# Patient Record
Sex: Female | Born: 1985 | Race: Black or African American | Hispanic: No | Marital: Married | Smoking: Former smoker
Health system: Southern US, Community
[De-identification: ages and names within clinical notes are randomized; demographics above are authoritative.]

## PROBLEM LIST (undated history)

## (undated) DIAGNOSIS — I82409 Acute embolism and thrombosis of unspecified deep veins of unspecified lower extremity: Secondary | ICD-10-CM

## (undated) DIAGNOSIS — I2699 Other pulmonary embolism without acute cor pulmonale: Secondary | ICD-10-CM

## (undated) HISTORY — PX: BACK SURGERY: SHX140

---

## 2013-11-27 ENCOUNTER — Emergency Department (HOSPITAL_COMMUNITY)
Admission: EM | Admit: 2013-11-27 | Discharge: 2013-11-27 | Disposition: A | Discharge status: Home / Self Care | Attending: Emergency Medicine | Admitting: Emergency Medicine

## 2013-11-27 ENCOUNTER — Encounter (HOSPITAL_COMMUNITY): Payer: Self-pay | Admitting: Emergency Medicine

## 2013-11-27 ENCOUNTER — Emergency Department (HOSPITAL_COMMUNITY)

## 2013-11-27 DIAGNOSIS — W108XXA Fall (on) (from) other stairs and steps, initial encounter: Secondary | ICD-10-CM | POA: Insufficient documentation

## 2013-11-27 DIAGNOSIS — Y92038 Other place in apartment as the place of occurrence of the external cause: Secondary | ICD-10-CM | POA: Diagnosis not present

## 2013-11-27 DIAGNOSIS — Y9389 Activity, other specified: Secondary | ICD-10-CM | POA: Diagnosis not present

## 2013-11-27 DIAGNOSIS — S82435A Nondisplaced oblique fracture of shaft of left fibula, initial encounter for closed fracture: Secondary | ICD-10-CM | POA: Diagnosis not present

## 2013-11-27 DIAGNOSIS — S99812A Other specified injuries of left ankle, initial encounter: Secondary | ICD-10-CM | POA: Diagnosis present

## 2013-11-27 DIAGNOSIS — S82402A Unspecified fracture of shaft of left fibula, initial encounter for closed fracture: Secondary | ICD-10-CM

## 2013-11-27 MED ORDER — ONDANSETRON HCL 4 MG/2ML IJ SOLN
4.0000 mg | Freq: Once | INTRAMUSCULAR | Status: AC
  Administered 2013-11-27: 4 mg via INTRAVENOUS
  Filled 2013-11-27: qty 2

## 2013-11-27 MED ORDER — OXYCODONE-ACETAMINOPHEN 5-325 MG PO TABS
1.0000 | ORAL_TABLET | Freq: Once | ORAL | Status: AC
  Administered 2013-11-27: 1 via ORAL
  Filled 2013-11-27: qty 1

## 2013-11-27 MED ORDER — OXYCODONE-ACETAMINOPHEN 5-325 MG PO TABS: 1.0000 | ORAL_TABLET | Freq: Four times a day (QID) | ORAL | Status: DC | PRN

## 2013-11-27 MED ORDER — ONDANSETRON HCL 4 MG PO TABS: 4.0000 mg | ORAL_TABLET | Freq: Four times a day (QID) | ORAL | Status: DC

## 2013-11-27 MED ORDER — MORPHINE SULFATE 4 MG/ML IJ SOLN
4.0000 mg | Freq: Once | INTRAMUSCULAR | Status: AC
  Administered 2013-11-27: 4 mg via INTRAVENOUS
  Filled 2013-11-27: qty 1

## 2013-11-27 MED ORDER — LORAZEPAM 2 MG/ML IJ SOLN
1.0000 mg | Freq: Once | INTRAMUSCULAR | Status: AC
  Administered 2013-11-27: 1 mg via INTRAVENOUS
  Filled 2013-11-27: qty 1

## 2013-11-27 NOTE — ED Notes (Signed)
Patient medicated prior to DC home, see MAR  

## 2013-11-27 NOTE — ED Provider Notes (Signed)
CSN: [id number]     Arrival date & time 11/27/13  0351 History   First MD Initiated Contact with Patient 11/27/13 0356     Chief Complaint  Patient presents with  . Fall  . Ankle Pain     (Consider location/radiation/quality/duration/timing/severity/associated sxs/prior Treatment) HPI Comments: The patient is an otherwise healthy 28 year old female who presents the emergency department after a fall. She reports that she was trying to walk down her apartment complex stairs around 3 this morning when she fell. She did not hit her head or lose consciousness. She is complaining of left ankle pain. She describes the pain as throbbing. She has full sensation to her foot, but describes it as "wearing her foot is waking up from being really cold". She can move all of her toes. No break in the skin. No other injuries.  Patient is a 28 y.o. female presenting with fall and ankle pain. The history is provided by the patient. No language interpreter was used.  Fall Associated symptoms include arthralgias and joint swelling. Pertinent negatives include no abdominal pain, chest pain, chills, fever, nausea or vomiting.  Ankle Pain Associated symptoms: no fever     History reviewed. No pertinent past medical history. History reviewed. No pertinent past surgical history. No family history on file. History  Substance Use Topics  . Smoking status: Not on file  . Smokeless tobacco: Not on file  . Alcohol Use: Not on file   OB History   Grav Para Term Preterm Abortions TAB SAB Ect Mult Living                 Review of Systems  Constitutional: Negative for fever and chills.  Respiratory: Negative for shortness of breath.   Cardiovascular: Negative for chest pain.  Gastrointestinal: Negative for nausea, vomiting and abdominal pain.  Musculoskeletal: Positive for arthralgias, gait problem and joint swelling.  All other systems reviewed and are negative.     Allergies  Ibuprofen  Home  Medications   Prior to Admission medications   Not on File   BP 120/62  Pulse 89  Temp(Src) 97.6 F (36.4 C) (Oral)  Resp 16  SpO2 100%  LMP 11/19/2013 Physical Exam  Nursing note and vitals reviewed. Constitutional: She is oriented to person, place, and time. She appears well-developed and well-nourished. She appears distressed.  Patient is very tearful  HENT:  Head: Normocephalic and atraumatic.  Right Ear: External ear normal.  Left Ear: External ear normal.  Nose: Nose normal.  Mouth/Throat: Oropharynx is clear and moist.  Eyes: Conjunctivae are normal.  Neck: Normal range of motion.  Cardiovascular: Normal rate, regular rhythm, normal heart sounds, intact distal pulses and normal pulses.   Pulses:      Dorsalis pedis pulses are 2+ on the left side.       Posterior tibial pulses are 2+ on the left side.  Capillary refill < 3 seconds in all toes  Pulmonary/Chest: Effort normal and breath sounds normal. No stridor. No respiratory distress. She has no wheezes. She has no rales.  Abdominal: Soft. She exhibits no distension.  Musculoskeletal:       Left ankle: She exhibits decreased range of motion.  Tender to palpation diffusely over left foot and ankle. Mild swelling. Neurovascularly intact, compartments soft.  No deformity seen.  Patient with full sensation and ability to move toes. ROM decreased due to pain.   Neurological: She is alert and oriented to person, place, and time. She has normal strength.  Skin: Skin is warm and dry. She is not diaphoretic. No erythema.  Psychiatric: She has a normal mood and affect. Her behavior is normal.    ED Course  Procedures (including critical care time) Labs Review Labs Reviewed - No data to display  Imaging Review Dg Ankle Complete Left  11/27/2013   CLINICAL DATA:  Fall down steps, lateral malleolus pain.  EXAM: LEFT ANKLE COMPLETE - 3+ VIEW; LEFT FOOT - COMPLETE 3+ VIEW  COMPARISON:  None.  FINDINGS: Oblique nondisplaced  distal fibular diaphyseal fracture above the ankle mortise. The ankle mortise appears congruent and tibiofibular syndesmosis is intact. No dislocation. No destructive bony lesions.  Congenitally foreshortened fourth proximal phalanx. Mild lateral ankle soft tissue swelling without subcutaneous gas or radiopaque foreign bodies.  IMPRESSION: Nondisplaced distal fibular diaphyseal fracture without dislocation.   Electronically Signed   By: Awilda Metro   On: 11/27/2013 05:12   Dg Foot Complete Left  11/27/2013   CLINICAL DATA:  Fall down steps, lateral malleolus pain.  EXAM: LEFT ANKLE COMPLETE - 3+ VIEW; LEFT FOOT - COMPLETE 3+ VIEW  COMPARISON:  None.  FINDINGS: Oblique nondisplaced distal fibular diaphyseal fracture above the ankle mortise. The ankle mortise appears congruent and tibiofibular syndesmosis is intact. No dislocation. No destructive bony lesions.  Congenitally foreshortened fourth proximal phalanx. Mild lateral ankle soft tissue swelling without subcutaneous gas or radiopaque foreign bodies.  IMPRESSION: Nondisplaced distal fibular diaphyseal fracture without dislocation.   Electronically Signed   By: Awilda Metro   On: 11/27/2013 05:12     EKG Interpretation None      MDM   Final diagnoses:  Closed fibular fracture, left, initial encounter    Patient present to emergency department with close fibular fracture after falling down steps. No other injuries. Neurovascularly intact in compartment is soft. Patient placed in splint and given orthopedic follow up. Patient feeling significantly improved after IV narcotics. Discussed reasons to return to ED immediately. Vital signs stable for discharge. Patient / Family / Caregiver informed of clinical course, understand medical decision-making process, and agree with plan.     Mora Bellman, PA-C 11/27/13 [phone]

## 2013-11-27 NOTE — Discharge Instructions (Signed)
Fibular Fracture, Ankle, Adult, Undisplaced, Treated With Immobilization °A simple fracture of the bone below the knee on the outside of your leg (fibula) usually heals without problems. °CAUSES °Typically, a fibular fracture occurs as a result of trauma. A blow to the side of your leg or a powerful twisting movement can cause a fracture. Fibular fractures are often seen as a result of football, soccer, or skiing injuries. °SYMPTOMS °Symptoms of a fibular fracture can include: °· Pain. °· Shortening or abnormal alignment of your lower leg (angulation). °DIAGNOSIS °A health care provider will need to examine the leg. X-ray exams will be ordered for further to confirm the fracture and evaluate the extent and of the injury. °TREATMENT  °Typically, a cast or immobilizer is applied. Sometimes a splint is placed on these fractures if it is needed for comfort or if the bones are badly out of place. Crutches may be needed to help you get around.  °HOME CARE INSTRUCTIONS  °· Apply ice to the injured area: °¨ Put ice in a plastic bag. °¨ Place a towel between your skin and the bag. °¨ Leave the ice on for 20 minutes, 2-3 times a day. °· Use crutches as directed. Resume walking without crutches as directed by your health care provider or when comfortable doing so. °· Only take over-the-counter or prescription medicines for pain, discomfort, or fever as directed by your health care provider. °· Keeping your leg raised may lessen swelling. °· If you have a removable splint or boot, do not remove the boot unless directed by your health care provider. °· Do not not drive a car or operate a motor vehicle until your health care provider specifically tells you it is safe to do so. °SEEK IMMEDIATE MEDICAL CARE IF:  °· Your cast gets damaged or breaks. °· You have continued severe pain or more swelling than you did before the cast was put on, or the pain is not controlled with medications. °· Your skin or nails below the injury turn  blue or grey, or feel cold or numb. °· There is a bad smell or pus coming from under the cast. °· You develop severe pain in ankle or foot. °MAKE SURE YOU:  °· Understand these instructions. °· Will watch your condition. °· Will get help right away if you are not doing well or get worse. °Document Released: 11/05/2001 Document Revised: 12/04/2012 Document Reviewed: 09/25/2012 °[employer]® Patient Information ©2015 [employer]. This information is not intended to replace advice given to you by your health care provider. Make sure you discuss any questions you have with your health care provider. ° °

## 2013-11-27 NOTE — ED Notes (Signed)
Pt arrives by EMS with complaints of falling off the bottom stair at her apartment complex at ~0300 this AM.  Pt reports some alcohol intake tonight-complaining of pain and swelling to left ankle and per EMS, some swelling to left ankle and lateral deformity towards heel area-EMS administered 150 mcg Fentanyl IV-#20 left ACF-No LOC-No neck or back pain

## 2013-11-27 NOTE — ED Notes (Signed)
Ortho tech at bedside 

## 2013-11-27 NOTE — ED Notes (Signed)
Ortho tech paged again via Designer, television/film setoperator by Misty StanleyLisa, AA

## 2013-11-27 NOTE — ED Notes (Signed)
DC instructions reviewed with patient Patient aware of need to make and keep f/u appointment with specialist Rx x 2 reviewed with patient along with DC instructions Patient agrees to and v/u of DC instructions, f/u care and Rx x 2 at time of DC Female family member who is at the bedside will be providing transportation home Patient alert and oriented x 4 and in NAD upon time of DC from ED

## 2013-11-27 NOTE — ED Notes (Addendum)
Assumed care of patient  Patient resting with eye closed--RR WNL, even and unlabored with equal rise and fall of chest Patient in NAD Female visitor at bedside Awaiting Ortho tech--phone busy, ED Charge Nurse aware Ortho tech paged

## 2013-11-27 NOTE — ED Notes (Signed)
Bed: WA14 Expected date:  Expected time:  Means of arrival:  Comments: EMS-fall 

## 2013-11-28 NOTE — ED Provider Notes (Signed)
Medical screening examination/treatment/procedure(s) were performed by non-physician practitioner and as supervising physician I was immediately available for consultation/collaboration.   EKG Interpretation None       Kamare Caspers, MD 11/28/13 0130 

## 2013-12-04 ENCOUNTER — Encounter (HOSPITAL_COMMUNITY): Payer: Self-pay | Admitting: Pharmacy Technician

## 2013-12-08 ENCOUNTER — Inpatient Hospital Stay (HOSPITAL_COMMUNITY)
Admission: RE | Admit: 2013-12-08 | Discharge: 2013-12-08 | Disposition: A | Discharge status: Home / Self Care | Source: Ambulatory Visit

## 2013-12-08 ENCOUNTER — Encounter (HOSPITAL_COMMUNITY): Payer: Self-pay

## 2013-12-10 MED ORDER — CEFAZOLIN SODIUM-DEXTROSE 2-3 GM-% IV SOLR
2.0000 g | INTRAVENOUS | Status: AC
  Administered 2013-12-11: 2 g via INTRAVENOUS
  Filled 2013-12-10: qty 50

## 2013-12-10 MED ORDER — ACETAMINOPHEN 500 MG PO TABS
1000.0000 mg | ORAL_TABLET | Freq: Once | ORAL | Status: AC
  Administered 2013-12-11: 1000 mg via ORAL
  Filled 2013-12-10: qty 2

## 2013-12-11 ENCOUNTER — Encounter (HOSPITAL_COMMUNITY)
Admission: RE | Disposition: A | Discharge status: Home / Self Care | Payer: Self-pay | Source: Ambulatory Visit | Attending: Orthopedic Surgery

## 2013-12-11 ENCOUNTER — Ambulatory Visit (HOSPITAL_COMMUNITY)
Admission: RE | Admit: 2013-12-11 | Discharge: 2013-12-11 | Disposition: A | Discharge status: Home / Self Care | Source: Ambulatory Visit | Attending: Orthopedic Surgery | Admitting: Orthopedic Surgery

## 2013-12-11 ENCOUNTER — Ambulatory Visit (HOSPITAL_COMMUNITY)

## 2013-12-11 ENCOUNTER — Ambulatory Visit (HOSPITAL_COMMUNITY): Admitting: Certified Registered"

## 2013-12-11 ENCOUNTER — Encounter (HOSPITAL_COMMUNITY): Payer: Self-pay | Admitting: Certified Registered"

## 2013-12-11 ENCOUNTER — Encounter (HOSPITAL_COMMUNITY): Admitting: Certified Registered"

## 2013-12-11 DIAGNOSIS — Z87891 Personal history of nicotine dependence: Secondary | ICD-10-CM | POA: Diagnosis not present

## 2013-12-11 DIAGNOSIS — Z888 Allergy status to other drugs, medicaments and biological substances status: Secondary | ICD-10-CM | POA: Insufficient documentation

## 2013-12-11 DIAGNOSIS — W19XXXA Unspecified fall, initial encounter: Secondary | ICD-10-CM | POA: Diagnosis not present

## 2013-12-11 DIAGNOSIS — Z419 Encounter for procedure for purposes other than remedying health state, unspecified: Secondary | ICD-10-CM

## 2013-12-11 DIAGNOSIS — Y92009 Unspecified place in unspecified non-institutional (private) residence as the place of occurrence of the external cause: Secondary | ICD-10-CM | POA: Insufficient documentation

## 2013-12-11 DIAGNOSIS — Z79899 Other long term (current) drug therapy: Secondary | ICD-10-CM | POA: Diagnosis not present

## 2013-12-11 DIAGNOSIS — S82892A Other fracture of left lower leg, initial encounter for closed fracture: Secondary | ICD-10-CM | POA: Diagnosis present

## 2013-12-11 DIAGNOSIS — S93432A Sprain of tibiofibular ligament of left ankle, initial encounter: Secondary | ICD-10-CM | POA: Diagnosis present

## 2013-12-11 DIAGNOSIS — S82842A Displaced bimalleolar fracture of left lower leg, initial encounter for closed fracture: Secondary | ICD-10-CM | POA: Diagnosis present

## 2013-12-11 DIAGNOSIS — S82899A Other fracture of unspecified lower leg, initial encounter for closed fracture: Secondary | ICD-10-CM

## 2013-12-11 HISTORY — PX: ORIF ANKLE FRACTURE: SHX5408

## 2013-12-11 LAB — CBC WITH DIFFERENTIAL/PLATELET
BASOS PCT: 0 % (ref 0–1)
Basophils Absolute: 0 10*3/uL (ref 0.0–0.1)
Eosinophils Absolute: 0.1 10*3/uL (ref 0.0–0.7)
Eosinophils Relative: 2 % (ref 0–5)
HCT: 35.5 % — ABNORMAL LOW (ref 36.0–46.0)
Hemoglobin: 12 g/dL (ref 12.0–15.0)
Lymphocytes Relative: 41 % (ref 12–46)
Lymphs Abs: 2.2 10*3/uL (ref 0.7–4.0)
MCH: 28.2 pg (ref 26.0–34.0)
MCHC: 33.8 g/dL (ref 30.0–36.0)
MCV: 83.5 fL (ref 78.0–100.0)
MONO ABS: 0.4 10*3/uL (ref 0.1–1.0)
Monocytes Relative: 8 % (ref 3–12)
Neutro Abs: 2.7 10*3/uL (ref 1.7–7.7)
Neutrophils Relative %: 49 % (ref 43–77)
Platelets: 399 10*3/uL (ref 150–400)
RBC: 4.25 MIL/uL (ref 3.87–5.11)
RDW: 13.6 % (ref 11.5–15.5)
WBC: 5.5 10*3/uL (ref 4.0–10.5)

## 2013-12-11 LAB — COMPREHENSIVE METABOLIC PANEL
ALT: 115 U/L — ABNORMAL HIGH (ref 0–35)
AST: 61 U/L — AB (ref 0–37)
Albumin: 3.1 g/dL — ABNORMAL LOW (ref 3.5–5.2)
Alkaline Phosphatase: 54 U/L (ref 39–117)
Anion gap: 11 (ref 5–15)
BUN: 14 mg/dL (ref 6–23)
CO2: 25 mEq/L (ref 19–32)
Calcium: 9.4 mg/dL (ref 8.4–10.5)
Chloride: 102 mEq/L (ref 96–112)
Creatinine, Ser: 0.9 mg/dL (ref 0.50–1.10)
GFR calc Af Amer: >90 mL/min (ref >90)
GFR, EST NON AFRICAN AMERICAN: 88 mL/min — AB (ref >90)
Glucose, Bld: 97 mg/dL (ref 70–99)
Potassium: 4.7 mEq/L (ref 3.7–5.3)
Sodium: 138 mEq/L (ref 137–147)
Total Bilirubin: <0.2 mg/dL — ABNORMAL LOW (ref 0.3–1.2)
Total Protein: 7.3 g/dL (ref 6.0–8.3)

## 2013-12-11 LAB — PROTIME-INR
INR: 1.12 (ref 0.00–1.49)
Prothrombin Time: 14.5 seconds (ref 11.6–15.2)

## 2013-12-11 LAB — HCG, SERUM, QUALITATIVE: Preg, Serum: NEGATIVE

## 2013-12-11 SURGERY — OPEN REDUCTION INTERNAL FIXATION (ORIF) ANKLE FRACTURE
Anesthesia: General | Laterality: Left

## 2013-12-11 MED ORDER — METHOCARBAMOL 500 MG PO TABS
ORAL_TABLET | ORAL | Status: AC
  Administered 2013-12-11: 500 mg via ORAL
  Filled 2013-12-11: qty 1

## 2013-12-11 MED ORDER — MIDAZOLAM HCL 5 MG/5ML IJ SOLN
INTRAMUSCULAR | Status: DC | PRN
  Administered 2013-12-11: 2 mg via INTRAVENOUS

## 2013-12-11 MED ORDER — PROPOFOL 10 MG/ML IV BOLUS
INTRAVENOUS | Status: AC
  Filled 2013-12-11: qty 20

## 2013-12-11 MED ORDER — FENTANYL CITRATE 0.05 MG/ML IJ SOLN
INTRAMUSCULAR | Status: AC
  Filled 2013-12-11: qty 5

## 2013-12-11 MED ORDER — KETOROLAC TROMETHAMINE 30 MG/ML IJ SOLN
30.0000 mg | Freq: Once | INTRAMUSCULAR | Status: AC
  Administered 2013-12-11: 30 mg via INTRAVENOUS

## 2013-12-11 MED ORDER — METHOCARBAMOL 500 MG PO TABS
500.0000 mg | ORAL_TABLET | Freq: Once | ORAL | Status: AC
  Administered 2013-12-11: 500 mg via ORAL

## 2013-12-11 MED ORDER — OXYCODONE HCL 5 MG PO TABS: 5.0000 mg | ORAL_TABLET | Freq: Four times a day (QID) | ORAL | Status: DC | PRN

## 2013-12-11 MED ORDER — ONDANSETRON HCL 4 MG/2ML IJ SOLN
4.0000 mg | Freq: Once | INTRAMUSCULAR | Status: AC | PRN
Start: 2013-12-11 — End: 2013-12-11
  Administered 2013-12-11: 4 mg via INTRAVENOUS

## 2013-12-11 MED ORDER — KETOROLAC TROMETHAMINE 30 MG/ML IJ SOLN
INTRAMUSCULAR | Status: AC
  Administered 2013-12-11: 30 mg via INTRAVENOUS
  Filled 2013-12-11: qty 1

## 2013-12-11 MED ORDER — HYDROMORPHONE HCL 1 MG/ML IJ SOLN
0.2500 mg | INTRAMUSCULAR | Status: DC | PRN
  Administered 2013-12-11 (×4): 0.5 mg via INTRAVENOUS

## 2013-12-11 MED ORDER — HYDROMORPHONE HCL 1 MG/ML IJ SOLN
INTRAMUSCULAR | Status: AC
  Administered 2013-12-11: 0.5 mg via INTRAVENOUS
  Filled 2013-12-11: qty 1

## 2013-12-11 MED ORDER — ONDANSETRON HCL 4 MG/2ML IJ SOLN
INTRAMUSCULAR | Status: DC | PRN
  Administered 2013-12-11: 4 mg via INTRAVENOUS

## 2013-12-11 MED ORDER — BUPIVACAINE HCL (PF) 0.25 % IJ SOLN
INTRAMUSCULAR | Status: AC
  Filled 2013-12-11: qty 30

## 2013-12-11 MED ORDER — FENTANYL CITRATE 0.05 MG/ML IJ SOLN
INTRAMUSCULAR | Status: DC | PRN
  Administered 2013-12-11: 50 ug via INTRAVENOUS
  Administered 2013-12-11: 100 ug via INTRAVENOUS
  Administered 2013-12-11: 50 ug via INTRAVENOUS
  Administered 2013-12-11: 100 ug via INTRAVENOUS
  Administered 2013-12-11: 50 ug via INTRAVENOUS

## 2013-12-11 MED ORDER — DEXAMETHASONE SODIUM PHOSPHATE 4 MG/ML IJ SOLN
INTRAMUSCULAR | Status: AC
  Filled 2013-12-11: qty 2

## 2013-12-11 MED ORDER — LACTATED RINGERS IV SOLN
INTRAVENOUS | Status: DC
  Administered 2013-12-11: 08:00:00 via INTRAVENOUS

## 2013-12-11 MED ORDER — MIDAZOLAM HCL 2 MG/2ML IJ SOLN
INTRAMUSCULAR | Status: AC
  Filled 2013-12-11: qty 2

## 2013-12-11 MED ORDER — OXYCODONE-ACETAMINOPHEN 5-325 MG PO TABS
1.0000 | ORAL_TABLET | Freq: Once | ORAL | Status: AC
  Administered 2013-12-11: 1 via ORAL

## 2013-12-11 MED ORDER — OXYCODONE-ACETAMINOPHEN 5-325 MG PO TABS
ORAL_TABLET | ORAL | Status: AC
  Administered 2013-12-11: 1 via ORAL
  Filled 2013-12-11: qty 1

## 2013-12-11 MED ORDER — OXYCODONE-ACETAMINOPHEN 5-325 MG PO TABS: 1.0000 | ORAL_TABLET | Freq: Four times a day (QID) | ORAL | Status: DC | PRN

## 2013-12-11 MED ORDER — METHOCARBAMOL 500 MG PO TABS: 500.0000 mg | ORAL_TABLET | Freq: Four times a day (QID) | ORAL | Status: DC | PRN

## 2013-12-11 MED ORDER — LIDOCAINE HCL 2 % EX GEL
CUTANEOUS | Status: AC
  Filled 2013-12-11: qty 20

## 2013-12-11 MED ORDER — ONDANSETRON HCL 4 MG/2ML IJ SOLN
INTRAMUSCULAR | Status: AC
  Filled 2013-12-11: qty 2

## 2013-12-11 MED ORDER — LIDOCAINE HCL (CARDIAC) 20 MG/ML IV SOLN
INTRAVENOUS | Status: AC
  Filled 2013-12-11: qty 5

## 2013-12-11 MED ORDER — CHLORHEXIDINE GLUCONATE 4 % EX LIQD
60.0000 mL | Freq: Once | CUTANEOUS | Status: DC
  Filled 2013-12-11: qty 60

## 2013-12-11 MED ORDER — SUCCINYLCHOLINE CHLORIDE 20 MG/ML IJ SOLN
INTRAMUSCULAR | Status: AC
  Filled 2013-12-11: qty 1

## 2013-12-11 MED ORDER — ONDANSETRON HCL 4 MG/2ML IJ SOLN
INTRAMUSCULAR | Status: AC
  Administered 2013-12-11: 4 mg via INTRAVENOUS
  Filled 2013-12-11: qty 2

## 2013-12-11 MED ORDER — PROPOFOL 10 MG/ML IV BOLUS
INTRAVENOUS | Status: DC | PRN
  Administered 2013-12-11: 200 mg via INTRAVENOUS

## 2013-12-11 MED ORDER — PROMETHAZINE HCL 12.5 MG PO TABS: 12.5000 mg | ORAL_TABLET | Freq: Four times a day (QID) | ORAL | Status: DC | PRN

## 2013-12-11 MED ORDER — LIDOCAINE HCL (CARDIAC) 20 MG/ML IV SOLN
INTRAVENOUS | Status: DC | PRN
  Administered 2013-12-11: 40 mg via INTRAVENOUS

## 2013-12-11 SURGICAL SUPPLY — 65 items
BANDAGE ELASTIC 4 VELCRO ST LF (GAUZE/BANDAGES/DRESSINGS) IMPLANT
BANDAGE ELASTIC 6 VELCRO ST LF (GAUZE/BANDAGES/DRESSINGS) ×3 IMPLANT
BANDAGE ESMARK 6X9 LF (GAUZE/BANDAGES/DRESSINGS) ×1 IMPLANT
BLADE SURG 10 STRL SS (BLADE) ×3 IMPLANT
BNDG COHESIVE 4X5 TAN STRL (GAUZE/BANDAGES/DRESSINGS) ×3 IMPLANT
BNDG ESMARK 6X9 LF (GAUZE/BANDAGES/DRESSINGS) ×3
BNDG GAUZE ELAST 4 BULKY (GAUZE/BANDAGES/DRESSINGS) ×6 IMPLANT
BRUSH SCRUB DISP (MISCELLANEOUS) ×6 IMPLANT
COVER SURGICAL LIGHT HANDLE (MISCELLANEOUS) ×6 IMPLANT
DRAPE C-ARM 42X72 X-RAY (DRAPES) ×3 IMPLANT
DRAPE C-ARMOR (DRAPES) ×6 IMPLANT
DRAPE ORTHO SPLIT 77X108 STRL (DRAPES) ×6
DRAPE PROXIMA HALF (DRAPES) ×3 IMPLANT
DRAPE SURG ORHT 6 SPLT 77X108 (DRAPES) ×3 IMPLANT
DRAPE U-SHAPE 47X51 STRL (DRAPES) ×3 IMPLANT
DRSG ADAPTIC 3X8 NADH LF (GAUZE/BANDAGES/DRESSINGS) ×3 IMPLANT
DRSG EMULSION OIL 3X3 NADH (GAUZE/BANDAGES/DRESSINGS) ×3 IMPLANT
DRSG PAD ABDOMINAL 8X10 ST (GAUZE/BANDAGES/DRESSINGS) ×3 IMPLANT
ELECT REM PT RETURN 9FT ADLT (ELECTROSURGICAL) ×3
ELECTRODE REM PT RTRN 9FT ADLT (ELECTROSURGICAL) ×1 IMPLANT
GAUZE SPONGE 4X4 12PLY STRL (GAUZE/BANDAGES/DRESSINGS) IMPLANT
GLOVE BIO SURGEON STRL SZ7.5 (GLOVE) ×3 IMPLANT
GLOVE BIO SURGEON STRL SZ8 (GLOVE) ×3 IMPLANT
GLOVE BIOGEL PI IND STRL 7.5 (GLOVE) ×1 IMPLANT
GLOVE BIOGEL PI IND STRL 8 (GLOVE) ×1 IMPLANT
GLOVE BIOGEL PI INDICATOR 7.5 (GLOVE) ×2
GLOVE BIOGEL PI INDICATOR 8 (GLOVE) ×2
GOWN STRL REUS W/ TWL LRG LVL3 (GOWN DISPOSABLE) ×2 IMPLANT
GOWN STRL REUS W/ TWL XL LVL3 (GOWN DISPOSABLE) ×1 IMPLANT
GOWN STRL REUS W/TWL LRG LVL3 (GOWN DISPOSABLE) ×4
GOWN STRL REUS W/TWL XL LVL3 (GOWN DISPOSABLE) ×2
KIT BASIN OR (CUSTOM PROCEDURE TRAY) ×3 IMPLANT
KIT ROOM TURNOVER OR (KITS) ×3 IMPLANT
MANIFOLD NEPTUNE II (INSTRUMENTS) ×3 IMPLANT
NEEDLE HYPO 21X1.5 SAFETY (NEEDLE) IMPLANT
NS IRRIG 1000ML POUR BTL (IV SOLUTION) ×3 IMPLANT
PACK GENERAL/GYN (CUSTOM PROCEDURE TRAY) ×3 IMPLANT
PAD ARMBOARD 7.5X6 YLW CONV (MISCELLANEOUS) ×6 IMPLANT
PAD CAST 4YDX4 CTTN HI CHSV (CAST SUPPLIES) ×1 IMPLANT
PADDING CAST COTTON 4X4 STRL (CAST SUPPLIES) ×2
PADDING CAST COTTON 6X4 STRL (CAST SUPPLIES) IMPLANT
PENCIL BUTTON HOLSTER BLD 10FT (ELECTRODE) ×3 IMPLANT
SCREW CANC FT ST SFS 4X16 (Screw) ×3 IMPLANT
SCREW CORT 3.5X44M SELF TAP (Screw) ×3 IMPLANT
SCREW CORT 3.5X46M SELF TAP (Screw) ×3 IMPLANT
SCREW CORTEX 3.5 12MM (Screw) ×6 IMPLANT
SCREW LOCK CORT ST 3.5X12 (Screw) ×3 IMPLANT
SPONGE GAUZE 4X4 12PLY STER LF (GAUZE/BANDAGES/DRESSINGS) ×3 IMPLANT
SPONGE LAP 18X18 X RAY DECT (DISPOSABLE) ×9 IMPLANT
SPONGE SCRUB IODOPHOR (GAUZE/BANDAGES/DRESSINGS) ×3 IMPLANT
STAPLER VISISTAT 35W (STAPLE) IMPLANT
SUCTION FRAZIER TIP 10 FR DISP (SUCTIONS) ×3 IMPLANT
SUT ETHILON 2 0 FS 18 (SUTURE) ×9 IMPLANT
SUT ETHILON 3 0 PS 1 (SUTURE) ×9 IMPLANT
SUT PDS AB 2-0 CT1 27 (SUTURE) IMPLANT
SUT VIC AB 2-0 CT1 27 (SUTURE) ×6
SUT VIC AB 2-0 CT1 TAPERPNT 27 (SUTURE) ×3 IMPLANT
SUT VIC AB 2-0 CT3 27 (SUTURE) IMPLANT
SYR CONTROL 10ML LL (SYRINGE) IMPLANT
TOWEL OR 17X24 6PK STRL BLUE (TOWEL DISPOSABLE) ×3 IMPLANT
TOWEL OR 17X26 10 PK STRL BLUE (TOWEL DISPOSABLE) ×6 IMPLANT
TUBE CONNECTING 12'X1/4 (SUCTIONS) ×1
TUBE CONNECTING 12X1/4 (SUCTIONS) ×2 IMPLANT
UNDERPAD 30X30 INCONTINENT (UNDERPADS AND DIAPERS) ×3 IMPLANT
WATER STERILE IRR 1000ML POUR (IV SOLUTION) ×3 IMPLANT

## 2013-12-11 NOTE — Anesthesia Preprocedure Evaluation (Addendum)
Anesthesia Evaluation  Patient identified by MRN, date of birth, ID band Patient awake    Reviewed: Allergy & Precautions, H&P , NPO status , Patient's Chart, lab work & pertinent test results  Airway Mallampati: II TM Distance: >3 FB     Dental  (+) Teeth Intact, Dental Advisory Given   Pulmonary former smoker,  breath sounds clear to auscultation        Cardiovascular Rhythm:Regular Rate:Normal     Neuro/Psych    GI/Hepatic   Endo/Other    Renal/GU      Musculoskeletal   Abdominal   Peds  Hematology   Anesthesia Other Findings   Reproductive/Obstetrics                          Anesthesia Physical Anesthesia Plan  ASA: II  Anesthesia Plan: General   Post-op Pain Management:    Induction: Intravenous  Airway Management Planned: Oral ETT  Additional Equipment:   Intra-op Plan:   Post-operative Plan:   Informed Consent: I have reviewed the patients History and Physical, chart, labs and discussed the procedure including the risks, benefits and alternatives for the proposed anesthesia with the patient or authorized representative who has indicated his/her understanding and acceptance.   Dental advisory given  Plan Discussed with: CRNA and Anesthesiologist  Anesthesia Plan Comments: (L. Ankle fracture  Plan GA with LMA and popliteal block)        Anesthesia Quick Evaluation

## 2013-12-11 NOTE — Brief Op Note (Signed)
12/11/2013  9:59 AM  PATIENT:  Marie Cruz  28 y.o. female  PRE-OPERATIVE DIAGNOSIS:   1. LEFT BIMALLEOLAR ANKLE FRACTURE EQUIVALENT 2. SYNDESMOSIS DISRUPTION  POST-OPERATIVE DIAGNOSIS:   1. LEFT BIMALLEOLAR ANKLE FRACTURE EQUIVALENT 2. SYNDESMOSIS DISRUPTION  PROCEDURE:  Procedure(s): 1. OPEN REDUCTION INTERNAL FIXATION (ORIF) LEFT  ANKLE LATERAL MALLEOLUS 2. OPEN REDUCTION INTERNAL FIXATION (ORIF) SYNDESMOSIS (Left)  SURGEON:  Surgeon(s) and Role:    * Budd PalmerMichael H Carlethia Mesquita, MD - Primary  PHYSICIAN ASSISTANT: Montez MoritaKeith Paul, PA-C  ANESTHESIA:   general  TOURNIQUET:  * Missing tourniquet times found for documented tourniquets in log:  [id number] *  DICTATION: .Other Dictation: Dictation Number [id number]

## 2013-12-11 NOTE — H&P (Signed)
Please see my adjacent note from earlier this am.  Myrene GalasMichael Stormie Ventola, MD [facility] [phone] (p)

## 2013-12-11 NOTE — H&P (Signed)
[facility] H[facility]   Chief Complaint:  L ankle fracture HPI:    28 y/o black female sustained a fall about 2 weeks ago while at home.  Pt presented to ED for evaluation and was found have a L lateral malleolus fracture. She was seen at our office for follow up.  After additional xrays it was determined that her syndesmosis was disrupted as well.  As such she presents today for fixation of her fibula and syndesmosis   History reviewed. No pertinent past medical history.  Past Surgical History  Procedure Laterality Date  . Back surgery      History reviewed. No pertinent family history. Social History:  reports that she has quit smoking. She has never used smokeless tobacco. She reports that she drinks alcohol. She reports that she does not use illicit drugs.  Allergies:  Allergies  Allergen Reactions  . Hydrocodone-Acetaminophen Nausea And Vomiting  . Ibuprofen Nausea Only    Medications Prior to Admission  Medication Sig Dispense Refill  . acetaminophen (TYLENOL) 500 MG tablet Take 250 mg by mouth every 6 (six) hours as needed for moderate pain.      . methocarbamol (ROBAXIN) 500 MG tablet Take 500 mg by mouth daily as needed for muscle spasms.      . Multiple Vitamin (MULTIVITAMIN WITH MINERALS) TABS tablet Take 1 tablet by mouth daily.      Lorita Officer. Norgestim-Eth Estrad Triphasic (ORTHO TRI-CYCLEN LO PO) Take 1 tablet by mouth daily.      . ondansetron (ZOFRAN) 4 MG tablet Take 4 mg by mouth every 6 (six) hours as needed for nausea or vomiting.      Marland Kitchen. OVER THE COUNTER MEDICATION Take 1 capsule by mouth daily. Green Tea Complex 500 mg      . oxyCODONE (OXY IR/ROXICODONE) 5 MG immediate release tablet Take 5 mg by mouth every 6 (six) hours as needed for severe pain.      Marland Kitchen. oxyCODONE-acetaminophen (PERCOCET/ROXICET) 5-325 MG per tablet Take by mouth every 6 (six) hours as needed for severe pain.        Results for orders placed during the hospital encounter of 12/11/13 (from  the past 48 hour(s))  CBC WITH DIFFERENTIAL     Status: Abnormal   Collection Time    12/11/13  7:21 AM      Result Value Ref Range   WBC 5.5  4.0 - 10.5 K/uL   RBC 4.25  3.87 - 5.11 MIL/uL   Hemoglobin 12.0  12.0 - 15.0 g/dL   HCT 16.135.5 (*) 09.636.0 - 04.546.0 %   MCV 83.5  78.0 - 100.0 fL   MCH 28.2  26.0 - 34.0 pg   MCHC 33.8  30.0 - 36.0 g/dL   RDW 40.913.6  81.111.5 - 91.415.5 %   Platelets 399  150 - 400 K/uL   Neutrophils Relative % 49  43 - 77 %   Neutro Abs 2.7  1.7 - 7.7 K/uL   Lymphocytes Relative 41  12 - 46 %   Lymphs Abs 2.2  0.7 - 4.0 K/uL   Monocytes Relative 8  3 - 12 %   Monocytes Absolute 0.4  0.1 - 1.0 K/uL   Eosinophils Relative 2  0 - 5 %   Eosinophils Absolute 0.1  0.0 - 0.7 K/uL   Basophils Relative 0  0 - 1 %   Basophils Absolute 0.0  0.0 - 0.1 K/uL   No results found.  Review of Systems  Constitutional:  Negative for fever and chills.  Respiratory: Negative for shortness of breath and wheezing.   Cardiovascular: Negative for chest pain and palpitations.  Gastrointestinal: Negative for nausea, vomiting and abdominal pain.  Musculoskeletal:       L ankle pain  No L knee pain   Neurological: Negative for tingling, sensory change and headaches.    Blood pressure 121/81, pulse 78, temperature 97.9 F (36.6 C), temperature source Oral, resp. rate 16, height 5\' 5"  (1.651 m), weight 74.39 kg (164 lb), last menstrual period 11/23/2013, SpO2 100.00%. Physical Exam  Constitutional: She appears well-developed and well-nourished.  HENT:  Head: Normocephalic and atraumatic.  Eyes: EOM are normal.  Neck: Normal range of motion. Neck supple.  Cardiovascular: Normal rate and regular rhythm.   Respiratory: Effort normal and breath sounds normal.  GI: Soft. Bowel sounds are normal.  Musculoskeletal:  Left Lower Extremity    No gross deformity    + swelling L ankle   TTP L lateral mall and medially    No tenderness with palpation of knee, proximal fibula   Soft tissue looks  good, wrinkles with gentle compression    DPN, SPN, TN sensation intact    EHL, FHL, AT, PT, peroneals, gastroc motor intact        Assessment/Plan  28 y/o female with bimall equivalent L ankle fracture and syndesmotic disruption   OR for ORIF L fibula and syndesmosis NWB x 8 weeks outpt procedure Risks and benefits reviewed with pt, she wishes to proceed   Mearl LatinKeith W. Dreyton Roessner, PA-C [facility] [phone] (P)  12/11/2013, 7:50 AM

## 2013-12-11 NOTE — Transfer of Care (Signed)
Immediate Anesthesia Transfer of Care Note  Patient: Marie Cruz  Procedure(s) Performed: Procedure(s): OPEN REDUCTION INTERNAL FIXATION (ORIF) LEFT  ANKLE AND SYNDESMOSIS (Left)  Patient Location: PACU  Anesthesia Type:GA combined with regional for post-op pain  Level of Consciousness: awake and alert   Airway & Oxygen Therapy: Patient Spontanous Breathing and Patient connected to nasal cannula oxygen  Post-op Assessment: Report given to PACU RN, Post -op Vital signs reviewed and stable and Patient moving all extremities  Post vital signs: Reviewed and stable  Complications: No apparent anesthesia complications

## 2013-12-11 NOTE — Anesthesia Procedure Notes (Addendum)
Anesthesia Regional Block:  Popliteal block  Pre-Anesthetic Checklist: ,, timeout performed, Correct Patient, Correct Site, Correct Laterality, Correct Procedure, Correct Position, site marked, Risks and benefits discussed,  Surgical consent,  Pre-op evaluation,  At surgeon's request and post-op pain management  Laterality: Left  Prep: chloraprep       Needles:   Needle Type: Echogenic Stimulator Needle     Needle Length: 9cm 9 cm Needle Gauge: 22 and 22 G    Additional Needles: Popliteal block Narrative:  Start time: 12/11/2013 7:55 AM End time: 12/11/2013 8:00 AM Injection made incrementally with aspirations every 5 mL.  Performed by: Personally   Additional Notes: 25 cc 0.5% marcaine with 1:200 Epi injected easily   Procedure Name: LMA Insertion Date/Time: 12/11/2013 8:19 AM Performed by: Charm BargesBUTLER, Luara Faye R Pre-anesthesia Checklist: Patient identified, Emergency Drugs available, Suction available, Patient being monitored and Timeout performed Patient Re-evaluated:Patient Re-evaluated prior to inductionOxygen Delivery Method: Circle system utilized Preoxygenation: Pre-oxygenation with 100% oxygen Intubation Type: IV induction Ventilation: Mask ventilation without difficulty LMA: LMA inserted LMA Size: 4.0 Number of attempts: 1 Placement Confirmation: positive ETCO2 and breath sounds checked- equal and bilateral Tube secured with: Tape Dental Injury: Teeth and Oropharynx as per pre-operative assessment

## 2013-12-11 NOTE — Progress Notes (Signed)
I have seen and examined the patient. I agree with the findings by Mr. Renae Fickleaul.  I discussed with the patient the risks and benefits of surgery for left ankle fracture, including the possibility of infection, nerve injury, vessel injury, wound breakdown, arthritis, symptomatic hardware, DVT/ PE, loss of motion, and need for further surgery among others.  She understood these risks and wished to proceed.   Budd PalmerHANDY,Anay Rathe H, MD 12/11/2013 7:43 AM

## 2013-12-11 NOTE — Op Note (Signed)
NAME:  Marie Cruz, Marie Cruz         ACCOUNT NO.:  [id number]  MEDICAL RECORD NO.:  [id number]  LOCATION:  [facility]                         FACILITY:  [facility]  PHYSICIAN:  Doralee AlbinoMichael H. Carola FrostHandy, M.D. DATE OF BIRTH:  1985/07/30  DATE OF PROCEDURE:  12/11/2013 DATE OF DISCHARGE:                              OPERATIVE REPORT   PREOPERATIVE DIAGNOSES: 1. Left bimalleolar fracture equivalent. 2. Left ankle syndesmosis disruption.  POSTOPERATIVE DIAGNOSES: 1. Left bimalleolar fracture equivalent. 2. Left ankle syndesmosis disruption.  PROCEDURES: 1. Open reduction and internal fixation of the left lateral malleolus. 2. Open reduction and internal fixation of left ankle syndesmosis.  SURGEON:  Doralee AlbinoMichael H. Carola FrostHandy, M.D.  ASSISTANT:  Mearl LatinKeith W Paul, PA-C.  ANESTHESIA:  General.  COMPLICATIONS:  None.  DISPOSITION:  PACU.  CONDITION:  Stable.  BRIEF SUMMARY AND INDICATION OF PROCEDURE:  Marie Cruz is a very pleasant 28- year-old female, who sustained a left lateral malleolus fracture with widening of the syndesmosis and lateral talar subluxation.  She has been followed for a resolution of soft tissue swelling and now presents for definitive repair.  I discussed with her preoperative risks and benefits of surgery, including the possibility of infection, nerve injury, vessel injury, DVT, PE, heart attack, stroke, arthritis, need for hardware removal including the syndesmotic screws and the possibility of syndesmotic screw breakage.  She understood these risks and did wish to proceed.  BRIEF SUMMARY OF PROCEDURE:  Marie Cruz received a supplemental popliteal block preoperatively.  She was then taken to the operating room where general anesthesia was induced.  The left lower extremity was prepped and draped in usual sterile fashion.  Tourniquet was placed about the thigh but never inflated during the procedure.  A standard lateral approach was made to the distal fibula, carefully looking for the superficial  peroneal nerve.  The soft tissues were retracted anteriorly and the fracture site was identified.  I then took a 7-hole plate and applied this along the posterolateral aspect of her fibula and used a lobster claw to interdigitate the fracture site and to achieve an anatomic reduction.  This was secured with a 3.5 mm cortical screw proximally and distally a cancellous screw.  I then used the [employer] clamp with a small incision off the medial metaphyseal flare, reduced the syndesmosis which eliminated the lateral subluxation of the talus. This was secured with two 3.5 cortical screws achieving 3 cortices of purchase with each.  The ankle had outstanding range of motion with no obstruction.  It was dorsiflexed during placement of the screws and centered.  The lateral flouro view showed appropriate position of the fibula with regard to the incinsura with a slight anterior trajectory of the screws.  There was very slight valgus created at the lateral malleolus site with tightening of the syndesmotic screws but this was minimal and the reduction remained satisfactory.  Wound was irrigated thoroughly and then closed in standard layered fashion with 2-0 Vicryl, 3-0 nylon.  Sterile, gently compressive dressing was applied and a posterior stirrup splint.  The patient was awakened from anesthesia and transferred to PACU in stable condition.  Marie MoritaKeith Paul, PA-C assisted me throughout.  PROGNOSIS:  Marie Cruz will be nonweightbearing on the left lower extremity for  the next 8 weeks with graduated weightbearing up to tolerance, to resume at that time.  We will plan to see her back in the office in 10-14 days for removal of her sutures.  At that time, we may consider transition into a cam boot for early motion versus another 2 weeks in a cast.     Doralee AlbinoMichael H. Carola FrostHandy, M.D.     MHH/MEDQ  D:  12/11/2013  T:  12/11/2013  Job:  [id number]

## 2013-12-11 NOTE — Discharge Instructions (Addendum)
[facility] Discharge Instructions   General Discharge Instructions  WEIGHT BEARING STATUS: Nonweightbearing Left leg  RANGE OF MOTION/ACTIVITY: no ankle range of motion at this time. Do not remove splint   Diet: as you were eating previously.  Can use over the counter stool softeners and bowel preparations, such as Miralax, to help with bowel movements.  Narcotics can be constipating.  Be sure to drink plenty of fluids  STOP SMOKING OR USING NICOTINE PRODUCTS!!!!  As discussed nicotine severely impairs your body's ability to heal surgical and traumatic wounds but also impairs bone healing.  Wounds and bone heal by forming microscopic blood vessels (angiogenesis) and nicotine is a vasoconstrictor (essentially, shrinks blood vessels).  Therefore, if vasoconstriction occurs to these microscopic blood vessels they essentially disappear and are unable to deliver necessary nutrients to the healing tissue.  This is one modifiable factor that you can do to dramatically increase your chances of healing your injury.    (This means no smoking, no nicotine gum, patches, etc)  DO NOT USE NONSTEROIDAL ANTI-INFLAMMATORY DRUGS (NSAID'S)  Using products such as Advil (ibuprofen), Aleve (naproxen), Motrin (ibuprofen) for additional pain control during fracture healing can delay and/or prevent the healing response.  If you would like to take over the counter (OTC) medication, Tylenol (acetaminophen) is ok.  However, some narcotic medications that are given for pain control contain acetaminophen as well. Therefore, you should not exceed more than 4000 mg of tylenol in a day if you do not have liver disease.  Also note that there are may OTC medicines, such as cold medicines and allergy medicines that my contain tylenol as well.  If you have any questions about medications and/or interactions please ask your doctor/PA or your pharmacist.   PAIN MEDICATION USE AND EXPECTATIONS  You have likely been  given narcotic medications to help control your pain.  After a traumatic event that results in an fracture (broken bone) with or without surgery, it is ok to use narcotic pain medications to help control one's pain.  We understand that everyone responds to pain differently and each individual patient will be evaluated on a regular basis for the continued need for narcotic medications. Ideally, narcotic medication use should last no more than 6-8 weeks (coinciding with fracture healing).   As a patient it is your responsibility as well to monitor narcotic medication use and report the amount and frequency you use these medications when you come to your office visit.   We would also advise that if you are using narcotic medications, you should take a dose prior to therapy to maximize you participation.  IF YOU ARE ON NARCOTIC MEDICATIONS IT IS NOT PERMISSIBLE TO OPERATE A MOTOR VEHICLE (MOTORCYCLE/CAR/TRUCK/MOPED) OR HEAVY MACHINERY DO NOT MIX NARCOTICS WITH OTHER CNS (CENTRAL NERVOUS SYSTEM) DEPRESSANTS SUCH AS ALCOHOL       ICE AND ELEVATE INJURED/OPERATIVE EXTREMITY  Using ice and elevating the injured extremity above your heart can help with swelling and pain control.  Icing in a pulsatile fashion, such as 20 minutes on and 20 minutes off, can be followed.    Do not place ice directly on skin. Make sure there is a barrier between to skin and the ice pack.    Using frozen items such as frozen peas works well as the conform nicely to the are that needs to be iced.  USE AN ACE WRAP OR TED HOSE FOR SWELLING CONTROL  In addition to icing and elevation, Ace wraps or TED hose  are used to help limit and resolve swelling.  It is recommended to use Ace wraps or TED hose until you are informed to stop.    When using Ace Wraps start the wrapping distally (farthest away from the body) and wrap proximally (closer to the body)   Example: If you had surgery on your leg or thing and you do not have a splint on,  start the ace wrap at the toes and work your way up to the thigh        If you had surgery on your upper extremity and do not have a splint on, start the ace wrap at your fingers and work your way up to the upper arm  IF YOU ARE IN A SPLINT OR CAST DO NOT REMOVE IT FOR ANY REASON   If your splint gets wet for any reason please contact the office immediately. You may shower in your splint or cast as long as you keep it dry.  This can be done by wrapping in a cast cover or garbage back (or similar)  Do Not stick any thing down your splint or cast such as pencils, money, or hangers to try and scratch yourself with.  If you feel itchy take benadryl as prescribed on the bottle for itching  IF YOU ARE IN A CAM BOOT (BLACK BOOT)  You may remove boot periodically. Perform daily dressing changes as noted below.  Wash the liner of the boot regularly and wear a sock when wearing the boot. It is recommended that you sleep in the boot until told otherwise  CALL THE OFFICE WITH ANY QUESTIONS OR CONCERTS: [phone]   What to eat:  For your first meals, you should eat lightly; only small meals initially.  If you do not have nausea, you may eat larger meals.  Avoid spicy, greasy and heavy food.    General Anesthesia, Adult, Care After  Refer to this sheet in the next few weeks. These instructions provide you with information on caring for yourself after your procedure. Your health care provider may also give you more specific instructions. Your treatment has been planned according to current medical practices, but problems sometimes occur. Call your health care provider if you have any problems or questions after your procedure.  WHAT TO EXPECT AFTER THE PROCEDURE  After the procedure, it is typical to experience:  Sleepiness.  Nausea and vomiting. HOME CARE INSTRUCTIONS  For the first 24 hours after general anesthesia:  Have a responsible person with you.  Do not drive a car. If you are alone, do not  take public transportation.  Do not drink alcohol.  Do not take medicine that has not been prescribed by your health care provider.  Do not sign important papers or make important decisions.  You may resume a normal diet and activities as directed by your health care provider.  Change bandages (dressings) as directed.  If you have questions or problems that seem related to general anesthesia, call the hospital and ask for the anesthetist or anesthesiologist on call. SEEK MEDICAL CARE IF:  You have nausea and vomiting that continue the day after anesthesia.  You develop a rash. SEEK IMMEDIATE MEDICAL CARE IF:  You have difficulty breathing.  You have chest pain.  You have any allergic problems. Document Released: 05/22/2000 Document Revised: 10/16/2012 Document Reviewed: 08/29/2012  [facility] Patient Information 2014 [place], [place].

## 2013-12-11 NOTE — Anesthesia Postprocedure Evaluation (Signed)
  Anesthesia Post-op Note  Patient: Marie Cruz  Procedure(s) Performed: Procedure(s): OPEN REDUCTION INTERNAL FIXATION (ORIF) LEFT  ANKLE AND SYNDESMOSIS (Left)  Patient Location: PACU  Anesthesia Type:General and GA combined with regional for post-op pain  Level of Consciousness: awake, alert  and oriented  Airway and Oxygen Therapy: Patient Spontanous Breathing and Patient connected to nasal cannula oxygen  Post-op Pain: mild  Post-op Assessment: Post-op Vital signs reviewed, Patient's Cardiovascular Status Stable, Respiratory Function Stable, Patent Airway and Pain level controlled  Post-op Vital Signs: stable  Last Vitals:  Filed Vitals:   12/11/13 1115  BP:   Pulse: 74  Temp:   Resp: 12    Complications: No apparent anesthesia complications

## 2013-12-16 ENCOUNTER — Encounter (HOSPITAL_COMMUNITY): Payer: Self-pay | Admitting: Orthopedic Surgery

## 2014-09-03 ENCOUNTER — Emergency Department (HOSPITAL_COMMUNITY)
Admission: EM | Admit: 2014-09-03 | Discharge: 2014-09-03 | Disposition: A | Discharge status: Home / Self Care | Attending: Emergency Medicine | Admitting: Emergency Medicine

## 2014-09-03 ENCOUNTER — Encounter (HOSPITAL_COMMUNITY): Payer: Self-pay

## 2014-09-03 ENCOUNTER — Emergency Department (HOSPITAL_BASED_OUTPATIENT_CLINIC_OR_DEPARTMENT_OTHER): Admit: 2014-09-03 | Discharge: 2014-09-03 | Disposition: A | Discharge status: Home / Self Care

## 2014-09-03 DIAGNOSIS — I82402 Acute embolism and thrombosis of unspecified deep veins of left lower extremity: Secondary | ICD-10-CM

## 2014-09-03 DIAGNOSIS — M7989 Other specified soft tissue disorders: Secondary | ICD-10-CM | POA: Diagnosis not present

## 2014-09-03 DIAGNOSIS — Z79899 Other long term (current) drug therapy: Secondary | ICD-10-CM | POA: Diagnosis not present

## 2014-09-03 DIAGNOSIS — Z87891 Personal history of nicotine dependence: Secondary | ICD-10-CM | POA: Insufficient documentation

## 2014-09-03 DIAGNOSIS — I824Z2 Acute embolism and thrombosis of unspecified deep veins of left distal lower extremity: Secondary | ICD-10-CM | POA: Diagnosis not present

## 2014-09-03 DIAGNOSIS — M79605 Pain in left leg: Secondary | ICD-10-CM

## 2014-09-03 LAB — ANTITHROMBIN III: AntiThromb III Func: 73 % — ABNORMAL LOW (ref 75–120)

## 2014-09-03 MED ORDER — RIVAROXABAN 15 MG PO TABS
15.0000 mg | ORAL_TABLET | Freq: Once | ORAL | Status: AC
  Administered 2014-09-03: 15 mg via ORAL
  Filled 2014-09-03 (×2): qty 1

## 2014-09-03 MED ORDER — XARELTO VTE STARTER PACK 15 & 20 MG PO TBPK: 15.0000 mg | ORAL_TABLET | ORAL | Status: DC

## 2014-09-03 NOTE — Progress Notes (Signed)
Bilateral lower extremity venous duplex completed.  Right:  No evidence of DVT, superficial thrombosis, or Baker's cyst.  Left: DVT noted in the distal common femoral, femoral, and popliteal veins.  No evidence of superficial thrombosis.  No Baker's cyst.

## 2014-09-03 NOTE — ED Notes (Signed)
Lab request 3 lav and 2 blue additional labs; sent by this RN at 1819.

## 2014-09-03 NOTE — ED Notes (Signed)
Pt sent from UC to rule out DVT to left leg. Pt reports hx of foot injury in Oct 2016 with placement of plates. Pt reports on and off swelling normal since injury; but new onset of discomfort and darker skin to LLE for past week and a half. UC preformed DG confirming correct plate placement; here for DVT evaluation.

## 2014-09-03 NOTE — ED Notes (Signed)
Pt educated on risks of DVT traveling to heart, lungs, or brain, pt educated on risks of xarelto causing bleeding. Pt educated to return to ED immediately for head injury, blunt trauma, uncontrolled bleeding, chest pain, SOB, or symptoms of CVA.

## 2014-09-03 NOTE — ED Notes (Signed)
Pt presents with c/o rule out DVT in her left leg. [facility] after an x[facility] to rule out DVT. Pt reports she has noticed some discomfort and swelling to that left lower leg for approx a week and a half that has gotten progressively worse.

## 2014-09-03 NOTE — Discharge Instructions (Signed)
You were found to have a deep vein blood clot in your leg, which would explain your symptoms. You were given your first dose here in the ER today, therefore start following the packaging instructions tomorrow. Your labs were drawn today to evaluate for the possible causes of your clot, your regular doctor will need to follow up with these. Call your regular doctor tomorrow to schedule an appointment for ongoing management of your blood clot. Apply heat and elevate your leg to help with the pain. Use tylenol or motrin as needed for pain. Return to the ER for any changes or worsening symptoms.    Deep Vein Thrombosis A deep vein thrombosis (DVT) is a blood clot that develops in the deep, larger veins of the leg, arm, or pelvis. These are more dangerous than clots that might form in veins near the surface of the body. A DVT can lead to serious and even life-threatening complications if the clot breaks off and travels in the bloodstream to the lungs.  A DVT can damage the valves in your leg veins so that instead of flowing upward, the blood pools in the lower leg. This is called post-thrombotic syndrome, and it can result in pain, swelling, discoloration, and sores on the leg. CAUSES Usually, several things contribute to the formation of blood clots. Contributing factors include:  The flow of blood slows down.  The inside of the vein is damaged in some way.  You have a condition that makes blood clot more easily. RISK FACTORS Some people are more likely than others to develop blood clots. Risk factors include:   Smoking.  Being overweight (obese).  Sitting or lying still for a long time. This includes long-distance travel, paralysis, or recovery from an illness or surgery. Other factors that increase risk are:   Older age, especially over 14 years of age.  Having a family history of blood clots or if you have already had a blot clot.  Having major or lengthy surgery. This is especially true  for surgery on the hip, knee, or belly (abdomen). Hip surgery is particularly high risk.  Having a long, thin tube (catheter) placed inside a vein during a medical procedure.  Breaking a hip or leg.  Having cancer or cancer treatment.  Pregnancy and childbirth.  Hormone changes make the blood clot more easily during pregnancy.  The fetus puts pressure on the veins of the pelvis.  There is a risk of injury to veins during delivery or a caesarean delivery. The risk is highest just after childbirth.  Medicines containing the female hormone estrogen. This includes birth control pills and hormone replacement therapy.  Other circulation or heart problems.  SIGNS AND SYMPTOMS When a clot forms, it can either partially or totally block the blood flow in that vein. Symptoms of a DVT can include:  Swelling of the leg or arm, especially if one side is much worse.  Warmth and redness of the leg or arm, especially if one side is much worse.  Pain in an arm or leg. If the clot is in the leg, symptoms may be more noticeable or worse when standing or walking. The symptoms of a DVT that has traveled to the lungs (pulmonary embolism, PE) usually start suddenly and include:  Shortness of breath.  Coughing.  Coughing up blood or blood-tinged mucus.  Chest pain. The chest pain is often worse with deep breaths.  Rapid heartbeat. Anyone with these symptoms should get emergency medical treatment right away. Do not  wait to see if the symptoms will go away. Call your local emergency services (911 in the [place] if you have these symptoms. Do not drive yourself to the hospital. DIAGNOSIS If a DVT is suspected, your health care provider will take a full medical history and perform a physical exam. Tests that also may be required include:  Blood tests, including studies of the clotting properties of the blood.  Ultrasound to see if you have clots in your legs or lungs.  X-rays to show the flow of  blood when dye is injected into the veins (venogram).  Studies of your lungs if you have any chest symptoms. PREVENTION  Exercise the legs regularly. Take a brisk 30-minute walk every day.  Maintain a weight that is appropriate for your height.  Avoid sitting or lying in bed for long periods of time without moving your legs.  Women, particularly those over the age of 35 years, should consider the risks and benefits of taking estrogen medicines, including birth control pills.  Do not smoke, especially if you take estrogen medicines.  Long-distance travel can increase your risk of DVT. You should exercise your legs by walking or pumping the muscles every hour.  Many of the risk factors above relate to situations that exist with hospitalization, either for illness, injury, or elective surgery. Prevention may include medical and nonmedical measures.  Your health care provider will assess you for the need for venous thromboembolism prevention when you are admitted to the hospital. If you are having surgery, your surgeon will assess you the day of or day after surgery. TREATMENT Once identified, a DVT can be treated. It can also be prevented in some circumstances. Once you have had a DVT, you may be at increased risk for a DVT in the future. The most common treatment for DVT is blood-thinning (anticoagulant) medicine, which reduces the blood's tendency to clot. Anticoagulants can stop new blood clots from forming and stop old clots from growing. They cannot dissolve existing clots. Your body does this by itself over time. Anticoagulants can be given by mouth, through an IV tube, or by injection. Your health care provider will determine the best program for you. Other medicines or treatments that may be used are:  Heparin or related medicines (low molecular weight heparin) are often the first treatment for a blood clot. They act quickly. However, they cannot be taken orally and must be given either  in shot form or by IV tube.  Heparin can cause a fall in a component of blood that stops bleeding and forms blood clots (platelets). You will be monitored with blood tests to be sure this does not occur.  Warfarin is an anticoagulant that can be swallowed. It takes a few days to start working, so usually heparin or related medicines are used in combination. Once warfarin is working, heparin is usually stopped.  Factor Xa inhibitor medicines, such as rivaroxaban and apixaban, also reduce blood clotting. These medicines are taken orally and can often be used without heparin or related medicines.  Less commonly, clot dissolving drugs (thrombolytics) are used to dissolve a DVT. They carry a high risk of bleeding, so they are used mainly in severe cases where your life or a part of your body is threatened.  Very rarely, a blood clot in the leg needs to be removed surgically.  If you are unable to take anticoagulants, your health care provider may arrange for you to have a filter placed in a main vein in  your abdomen. This filter prevents clots from traveling to your lungs. HOME CARE INSTRUCTIONS  Take all medicines as directed by your health care provider.  Learn as much as you can about DVT.  Wear a medical alert bracelet or carry a medical alert card.  Ask your health care provider how soon you can go back to normal activities. It is important to stay active to prevent blood clots. If you are on anticoagulant medicine, avoid contact sports.  It is very important to exercise. This is especially important while traveling, sitting, or standing for long periods of time. Exercise your legs by walking or by tightening and relaxing your leg muscles regularly. Take frequent walks.  You may need to wear compression stockings. These are tight elastic stockings that apply pressure to the lower legs. This pressure can help keep the blood in the legs from clotting. Taking Warfarin Warfarin is a daily  medicine that is taken by mouth. Your health care provider will advise you on the length of treatment (usually 3-6 months, sometimes lifelong). If you take warfarin:  Understand how to take warfarin and foods that can affect how warfarin works in Public relations account executiveyour body.  Too much and too little warfarin are both dangerous. Too much warfarin increases the risk of bleeding. Too little warfarin continues to allow the risk for blood clots. Warfarin and Regular Blood Testing While taking warfarin, you will need to have regular blood tests to measure your blood clotting time. These blood tests usually include both the prothrombin time (PT) and international normalized ratio (INR) tests. The PT and INR results allow your health care provider to adjust your dose of warfarin. It is very important that you have your PT and INR tested as often as directed by your health care provider.  Warfarin and Your Diet Avoid major changes in your diet, or notify your health care provider before changing your diet. Arrange a visit with a registered dietitian to answer your questions. Many foods, especially foods high in vitamin K, can interfere with warfarin and affect the PT and INR results. You should eat a consistent amount of foods high in vitamin K. Foods high in vitamin K include:   Spinach, kale, broccoli, cabbage, collard and turnip greens, Brussels sprouts, peas, cauliflower, seaweed, and parsley.  Beef and pork liver.  Green tea.  Soybean oil. Warfarin with Other Medicines Many medicines can interfere with warfarin and affect the PT and INR results. You must:  Tell your health care provider about any and all medicines, vitamins, and supplements you take, including aspirin and other over-the-counter anti-inflammatory medicines. Be especially cautious with aspirin and anti-inflammatory medicines. Ask your health care provider before taking these.  Do not take or discontinue any prescribed or over-the-counter medicine  except on the advice of your health care provider or pharmacist. Warfarin Side Effects Warfarin can have side effects, such as easy bruising and difficulty stopping bleeding. Ask your health care provider or pharmacist about other side effects of warfarin. You will need to:  Hold pressure over cuts for longer than usual.  Notify your dentist and other health care providers that you are taking warfarin before you undergo any procedures where bleeding may occur. Warfarin with Alcohol and Tobacco   Drinking alcohol frequently can increase the effect of warfarin, leading to excess bleeding. It is best to avoid alcoholic drinks or to consume only very small amounts while taking warfarin. Notify your health care provider if you change your alcohol intake.   Do not  use any tobacco products including cigarettes, chewing tobacco, or electronic cigarettes. If you smoke, quit. Ask your health care provider for help with quitting smoking. Alternative Medicines to Warfarin: Factor Xa Inhibitor Medicines  These blood-thinning medicines are taken by mouth, usually for several weeks or longer. It is important to take the medicine every single day at the same time each day.  There are no regular blood tests required when using these medicines.  There are fewer food and drug interactions than with warfarin.  The side effects of this class of medicine are similar to those of warfarin, including excessive bruising or bleeding. Ask your health care provider or pharmacist about other potential side effects. SEEK MEDICAL CARE IF:  You notice a rapid heartbeat.  You feel weaker or more tired than usual.  You feel faint.  You notice increased bruising.  You feel your symptoms are not getting better in the time expected.  You believe you are having side effects of medicine. SEEK IMMEDIATE MEDICAL CARE IF:  You have chest pain.  You have trouble breathing.  You have new or increased swelling or pain in  one leg.  You cough up blood.  You notice blood in vomit, in a bowel movement, or in urine. MAKE SURE YOU:  Understand these instructions.  Will watch your condition.  Will get help right away if you are not doing well or get worse. Document Released: 02/13/2005 Document Revised: 06/30/2013 Document Reviewed: 10/21/2012 [facility] Patient Information 2015 [place], [place]. This information is not intended to replace advice given to you by your health care provider. Make sure you discuss any questions you have with your health care provider.

## 2014-09-03 NOTE — ED Provider Notes (Signed)
CSN: [id number]     Arrival date & time 09/03/14  1442 History   First MD Initiated Contact with Patient 09/03/14 1506     Chief Complaint  Patient presents with  . Rule out DVT      (Consider location/radiation/quality/duration/timing/severity/associated sxs/prior Treatment) HPI Comments: Analyce Cruz is a 29 y.o. female with a PMHx of L ankle fx s/p ORIF on 12/11/13, who presents to the ED with complaints of left leg pain 1.5 weeks. She reports that she noticed a knot on the back of her left calf approximately 1-1/2 weeks ago, and since then she has had intermittent 1/10 pinching type pain in the calf and ankle, worse with walking, and unrelieved with ice and elevation. Associated symptoms include the swelling to this not as well as a darkened skin color to the ankle. She denies any fevers, chills, chest pain, shortness breath, cough, recent travel or surgeries, recent immobilization, claudication, orthopnea, abdominal pain, nausea, vomiting, diarrhea, constipation, numbness, tingling, weakness, erythema or warmth of the leg, or personal/family history of DVT/PE. She is currently on OCPs. No reinjury. [facility] to [facility] and they performed an xray which showed proper placement of the hardware in her leg, but no other findings, therefore she was sent here for eval of DVT.  Patient is a 29 y.o. female presenting with leg pain. The history is provided by the patient. No language interpreter was used.  Leg Pain Location:  Leg Time since incident:  2 weeks Injury: no   Leg location:  L lower leg Pain details:    Quality: pinching.   Radiates to:  Does not radiate   Severity:  Mild   Onset quality:  Gradual   Duration:  2 weeks   Timing:  Intermittent   Progression:  Unchanged Chronicity:  New Prior injury to area:  Yes (ORIF 12-11-13) Relieved by:  Nothing Worsened by:  Activity Ineffective treatments:  Ice and elevation Associated symptoms: swelling   Associated symptoms: no  decreased ROM, no fever, no muscle weakness, no numbness and no tingling     History reviewed. No pertinent past medical history. Past Surgical History  Procedure Laterality Date  . Back surgery    . Orif ankle fracture Left 12/11/2013    Procedure: OPEN REDUCTION INTERNAL FIXATION (ORIF) LEFT  ANKLE AND SYNDESMOSIS;  Surgeon: Budd Palmer, MD;  Location: [facility] OR;  Service: Orthopedics;  Laterality: Left;   No family history on file. History  Substance Use Topics  . Smoking status: Former Games developer  . Smokeless tobacco: Never Used  . Alcohol Use: Yes     Comment: occasionally   OB History    No data available     Review of Systems  Constitutional: Negative for fever and chills.  Respiratory: Negative for cough and shortness of breath.   Cardiovascular: Positive for leg swelling (knot on LLE). Negative for chest pain.  Gastrointestinal: Negative for nausea, vomiting, abdominal pain and diarrhea.  Musculoskeletal: Positive for myalgias (LLE). Negative for arthralgias.  Skin: Positive for color change (darker skin to L ankle).  Allergic/Immunologic: Negative for immunocompromised state.  Neurological: Negative for weakness and numbness.  Hematological: Does not bruise/bleed easily.  Psychiatric/Behavioral: Negative for confusion.   10 Systems reviewed and are negative for acute change except as noted in the HPI.    Allergies  Hydrocodone-acetaminophen and Ibuprofen  Home Medications   Prior to Admission medications   Medication Sig Start Date End Date Taking? Authorizing Provider  Multiple Vitamin (MULTIVITAMIN WITH  MINERALS) TABS tablet Take 1 tablet by mouth daily.   Yes Historical Provider, MD  Norgestim-Eth Charlott Holler Triphasic (ORTHO TRI-CYCLEN LO PO) Take 1 tablet by mouth daily.   Yes Historical Provider, MD  OVER THE COUNTER MEDICATION Take 1 capsule by mouth daily. Green Tea Complex 500 mg   Yes Historical Provider, MD   BP 133/94 mmHg  Pulse 88  Temp(Src) 98.3 F  (36.8 C) (Oral)  Resp 18  SpO2 100%  LMP 08/31/2014 (Approximate) Physical Exam  Constitutional: She is oriented to person, place, and time. Vital signs are normal. She appears well-developed and well-nourished.  Non-toxic appearance. No distress.  Afebrile, nontoxic, NAD  HENT:  Head: Normocephalic and atraumatic.  Mouth/Throat: Oropharynx is clear and moist and mucous membranes are normal.  Eyes: Conjunctivae and EOM are normal. Right eye exhibits no discharge. Left eye exhibits no discharge.  Neck: Normal range of motion. Neck supple.  Cardiovascular: Normal rate, regular rhythm, normal heart sounds and intact distal pulses.  Exam reveals no gallop and no friction rub.   No murmur heard. Pulmonary/Chest: Effort normal and breath sounds normal. No respiratory distress. She has no decreased breath sounds. She has no wheezes. She has no rhonchi. She has no rales.  Abdominal: Soft. Normal appearance and bowel sounds are normal. She exhibits no distension. There is no tenderness. There is no rigidity, no rebound, no guarding, no CVA tenderness, no tenderness at McBurney's point and negative Murphy's sign.  Musculoskeletal: Normal range of motion.       Left ankle: She exhibits swelling. She exhibits normal range of motion, no deformity, no laceration and normal pulse. Tenderness. Medial malleolus tenderness found. Achilles tendon normal.       Left lower leg: She exhibits tenderness. She exhibits no bony tenderness, no swelling and no edema.       Legs: L ankle with mild swelling without pitting edema, slightly darker skin, no erythema or warmth, minimally tender over medial malleolus, FROM intact, wiggles all digits well, strength and sensation grossly intact, distal pulses intact. L calf with palpable knot, mildly tender to palpation, approx 1/3 the way superior to the ankle, with no overlying skin changes. Achilles intact.  Neurological: She is alert and oriented to person, place, and time.  She has normal strength. No sensory deficit.  Skin: Skin is warm, dry and intact. No rash noted.  Psychiatric: She has a normal mood and affect.  Nursing note and vitals reviewed.   ED Course  Procedures (including critical care time) Labs Review Labs Reviewed  ANTITHROMBIN III  PROTEIN C ACTIVITY  PROTEIN C, TOTAL  PROTEIN S ACTIVITY  PROTEIN S, TOTAL  LUPUS ANTICOAGULANT PANEL  BETA-2-GLYCOPROTEIN I ABS, IGG/M/A  HOMOCYSTEINE  FACTOR 5 LEIDEN  PROTHROMBIN GENE MUTATION  CARDIOLIPIN ANTIBODIES, IGG, IGM, IGA    Imaging Review No results found.  Duplex LE DVT 09/03/14: Bilateral lower extremity venous duplex completed. Right: No evidence of DVT, superficial thrombosis, or Baker's cyst. Left: DVT noted in the distal common femoral, femoral, and popliteal veins. No evidence of superficial thrombosis. No Baker's cyst.    EKG Interpretation None      MDM   Final diagnoses:  Leg DVT (deep venous thromboembolism), acute, left  Pain of left lower extremity    29 y.o. female here for r/o of DVT in L calf. 1.5wks of worsened pain and a swollen knot on the back of her calf, on OCPs, seen at [facility] and had xray which was negative for changes in  hardware from prior ORIF, send here for DVT r/o. Some mild swelling to ankle which seems chronic, skin somewhat darker, without warmth or erythema, no pitting edema, with knot palpable approx 1/3 the distance up from her ankle, mildly TTP. Will obtain duplex U/S. Pt declines pain meds. Will reassess shortly.   5:21 PM DVT study showing DVT. Will start on xarelto and draw hypercoagulable panel. Discussed close f/up with PCP, and d/c home with xarelto. Strict return precautions discussed. Will await for pt to get xarelto dose here.  6:25 PM Xarelto given. D/c instructions reiterated. Discussed tylenol as needed for pain and heat/elevation for pain and swelling. Will have her f/up with PCP. I explained the diagnosis and have given explicit  precautions to return to the ER including for any other new or worsening symptoms. The patient understands and accepts the medical plan as it's been dictated and I have answered their questions. Discharge instructions concerning home care and prescriptions have been given. The patient is STABLE and is discharged to home in good condition.  BP 129/86 mmHg  Pulse 84  Temp(Src) 98.3 F (36.8 C) (Oral)  Resp 18  SpO2 100%  LMP 08/31/2014 (Approximate)  Meds ordered this encounter  Medications  . DISCONTD: meloxicam (MOBIC) 15 MG tablet    Sig: Take 15 mg by mouth daily.    Refill:  0  . Rivaroxaban (XARELTO) tablet 15 mg    Sig:   . XARELTO STARTER PACK 15 & 20 MG TBPK    Sig: Take 15-20 mg by mouth as directed. Take as directed on package: Start with one 15mg  tablet by mouth twice a day with food. On Day 22, switch to one 20mg  tablet once a day with food.    Dispense:  51 each    Refill:  0    Order Specific Question:  Supervising Provider    Answer:  Eber HongMILLER, BRIAN [[id number]]      Tran Arzuaga Camprubi-Soms, PA-C 09/03/14 1826  Arby BarretteMarcy Pfeiffer, MD 09/07/14 [phone]

## 2014-09-04 ENCOUNTER — Emergency Department (HOSPITAL_COMMUNITY)
Admission: EM | Admit: 2014-09-04 | Discharge: 2014-09-05 | Disposition: A | Discharge status: Home / Self Care | Attending: Emergency Medicine | Admitting: Emergency Medicine

## 2014-09-04 ENCOUNTER — Encounter (HOSPITAL_COMMUNITY): Payer: Self-pay | Admitting: Emergency Medicine

## 2014-09-04 DIAGNOSIS — Z7901 Long term (current) use of anticoagulants: Secondary | ICD-10-CM | POA: Insufficient documentation

## 2014-09-04 DIAGNOSIS — Z86718 Personal history of other venous thrombosis and embolism: Secondary | ICD-10-CM | POA: Insufficient documentation

## 2014-09-04 DIAGNOSIS — Z87891 Personal history of nicotine dependence: Secondary | ICD-10-CM | POA: Insufficient documentation

## 2014-09-04 DIAGNOSIS — Z79899 Other long term (current) drug therapy: Secondary | ICD-10-CM | POA: Insufficient documentation

## 2014-09-04 DIAGNOSIS — I2699 Other pulmonary embolism without acute cor pulmonale: Secondary | ICD-10-CM

## 2014-09-04 HISTORY — DX: Acute embolism and thrombosis of unspecified deep veins of unspecified lower extremity: I82.409

## 2014-09-04 HISTORY — DX: Other pulmonary embolism without acute cor pulmonale: I26.99

## 2014-09-04 LAB — CBC WITH DIFFERENTIAL/PLATELET
BASOS ABS: 0 10*3/uL (ref 0.0–0.1)
BASOS PCT: 1 % (ref 0–1)
Eosinophils Absolute: 0.2 10*3/uL (ref 0.0–0.7)
Eosinophils Relative: 4 % (ref 0–5)
HCT: 38.6 % (ref 36.0–46.0)
Hemoglobin: 12.9 g/dL (ref 12.0–15.0)
Lymphocytes Relative: 42 % (ref 12–46)
Lymphs Abs: 2.5 10*3/uL (ref 0.7–4.0)
MCH: 27.3 pg (ref 26.0–34.0)
MCHC: 33.4 g/dL (ref 30.0–36.0)
MCV: 81.8 fL (ref 78.0–100.0)
Monocytes Absolute: 0.3 10*3/uL (ref 0.1–1.0)
Monocytes Relative: 6 % (ref 3–12)
Neutro Abs: 3 10*3/uL (ref 1.7–7.7)
Neutrophils Relative %: 49 % (ref 43–77)
Platelets: 379 10*3/uL (ref 150–400)
RBC: 4.72 MIL/uL (ref 3.87–5.11)
RDW: 14.3 % (ref 11.5–15.5)
WBC: 6.1 10*3/uL (ref 4.0–10.5)

## 2014-09-04 LAB — PROTIME-INR
INR: 1.17 (ref 0.00–1.49)
PROTHROMBIN TIME: 15.1 s (ref 11.6–15.2)

## 2014-09-04 LAB — I-STAT BETA HCG BLOOD, ED (MC, WL, AP ONLY): I-stat hCG, quantitative: <5 m[IU]/mL (ref <5)

## 2014-09-04 LAB — BASIC METABOLIC PANEL
Anion gap: 9 (ref 5–15)
BUN: 7 mg/dL (ref 6–20)
CO2: 24 mmol/L (ref 22–32)
Calcium: 9.2 mg/dL (ref 8.9–10.3)
Chloride: 106 mmol/L (ref 101–111)
Creatinine, Ser: 0.84 mg/dL (ref 0.44–1.00)
GFR calc Af Amer: >60 mL/min (ref >60)
GLUCOSE: 80 mg/dL (ref 65–99)
Potassium: 3.5 mmol/L (ref 3.5–5.1)
SODIUM: 139 mmol/L (ref 135–145)

## 2014-09-04 LAB — I-STAT TROPONIN, ED: Troponin i, poc: 0 ng/mL (ref 0.00–0.08)

## 2014-09-04 LAB — HOMOCYSTEINE: Homocysteine: 7.4 umol/L (ref 0.0–15.0)

## 2014-09-04 MED ORDER — RIVAROXABAN 15 MG PO TABS
15.0000 mg | ORAL_TABLET | Freq: Once | ORAL | Status: AC
  Administered 2014-09-04: 15 mg via ORAL
  Filled 2014-09-04: qty 1

## 2014-09-04 NOTE — ED Notes (Signed)
Pt had CT scan showing right sided PE: pt seen for DVT in left leg recently

## 2014-09-04 NOTE — ED Notes (Signed)
[facility]. Denies shortness of breath. Denies pain currently.

## 2014-09-04 NOTE — ED Provider Notes (Signed)
CSN: [id number]     Arrival date & time 09/04/14  1842 History   First MD Initiated Contact with Patient 09/04/14 2101     Chief Complaint  Patient presents with  . PE   . DVT     (Consider location/radiation/quality/duration/timing/severity/associated sxs/prior Treatment) Patient is a 29 y.o. female presenting with chest pain. The history is provided by the patient.  Chest Pain Pain location:  R chest Pain quality: sharp   Pain radiates to:  Does not radiate Pain radiates to the back: no   Pain severity:  Mild Onset quality:  Gradual Timing:  Constant Progression:  Unchanged Chronicity:  New Context: at rest   Relieved by:  Nothing Worsened by:  Nothing tried Ineffective treatments:  None tried Associated symptoms: no abdominal pain, no back pain, no cough, no dizziness, no fatigue, no fever, no headache, no nausea, no shortness of breath and not vomiting     Past Medical History  Diagnosis Date  . DVT (deep venous thrombosis)   . PE (pulmonary embolism)    Past Surgical History  Procedure Laterality Date  . Back surgery    . Orif ankle fracture Left 12/11/2013    Procedure: OPEN REDUCTION INTERNAL FIXATION (ORIF) LEFT  ANKLE AND SYNDESMOSIS;  Surgeon: Budd Palmer, MD;  Location: [facility] OR;  Service: Orthopedics;  Laterality: Left;   History reviewed. No pertinent family history. History  Substance Use Topics  . Smoking status: Former Games developer  . Smokeless tobacco: Never Used  . Alcohol Use: Yes     Comment: occasionally   OB History    No data available     Review of Systems  Constitutional: Negative for fever and fatigue.  HENT: Negative for congestion and drooling.   Eyes: Negative for pain.  Respiratory: Negative for cough and shortness of breath.   Cardiovascular: Positive for chest pain.  Gastrointestinal: Negative for nausea, vomiting, abdominal pain and diarrhea.  Genitourinary: Negative for dysuria and hematuria.  Musculoskeletal: Negative for back  pain, gait problem and neck pain.  Skin: Negative for color change.  Neurological: Negative for dizziness and headaches.  Hematological: Negative for adenopathy.  Psychiatric/Behavioral: Negative for behavioral problems.  All other systems reviewed and are negative.     Allergies  Hydrocodone-acetaminophen and Ibuprofen  Home Medications   Prior to Admission medications   Medication Sig Start Date End Date Taking? Authorizing Provider  Multiple Vitamin (MULTIVITAMIN WITH MINERALS) TABS tablet Take 1 tablet by mouth daily.    Historical Provider, MD  Norgestim-Eth Charlott Holler Triphasic (ORTHO TRI-CYCLEN LO PO) Take 1 tablet by mouth daily.    Historical Provider, MD  OVER THE COUNTER MEDICATION Take 1 capsule by mouth daily. Green Tea Complex 500 mg    Historical Provider, MD  XARELTO STARTER PACK 15 & 20 MG TBPK Take 15-20 mg by mouth as directed. Take as directed on package: Start with one  tablet by mouth twice a day with food. On Day 22, switch to one  tablet once a day with food. 09/03/14   Mercedes Camprubi-Soms, PA-C   BP 125/80 mmHg  Pulse 83  Temp(Src) 98.4 F (36.9 C) (Oral)  Resp 19  SpO2 100%  LMP 08/31/2014 (Approximate) Physical Exam  Constitutional: She is oriented to person, place, and time. She appears well-developed and well-nourished.  HENT:  Head: Normocephalic.  Mouth/Throat: Oropharynx is clear and moist. No oropharyngeal exudate.  Eyes: Conjunctivae and EOM are normal. Pupils are equal, round, and reactive to light.  Neck: Normal  range of motion. Neck supple.  Cardiovascular: Normal rate, regular rhythm, normal heart sounds and intact distal pulses.  Exam reveals no gallop and no friction rub.   No murmur heard. Pulmonary/Chest: Effort normal and breath sounds normal. No respiratory distress. She has no wheezes.  Abdominal: Soft. Bowel sounds are normal. There is no tenderness. There is no rebound and no guarding.  Musculoskeletal: Normal range of  motion. She exhibits no edema or tenderness.  Mild swelling to the posterior aspect of the left lower calf.  Neurological: She is alert and oriented to person, place, and time.  Skin: Skin is warm and dry.  Psychiatric: She has a normal mood and affect. Her behavior is normal.  Nursing note and vitals reviewed.   ED Course  Procedures (including critical care time) Labs Review Labs Reviewed  BASIC METABOLIC PANEL  CBC WITH DIFFERENTIAL/PLATELET  PROTIME-INR  I-STAT TROPOININ, ED  I-STAT BETA HCG BLOOD, ED ([facility], [facility], [facility] ONLY)    Imaging Review No results found.   EKG Interpretation   Date/Time:  Friday September 04 2014 21:53:41 EDT Ventricular Rate:  70 PR Interval:  185 QRS Duration: 82 QT Interval:  384 QTC Calculation: 414 R Axis:   70 Text Interpretation:  Sinus rhythm Consider left atrial enlargement  Confirmed by Macaela Presas  MD, Emersynn Deatley ([id number]) on 09/04/2014 9:56:05 PM        MDM   Final diagnoses:  Pulmonary embolism    9:50 PM 29 y.o. female who presents with a PE which was noted on outside imaging. The patient was seen at [facility] yesterday for left leg pain and was found to have a DVT. She was started on xarelto. She states that she took her first dose there and plan to get the medication today but had not taken any Xarelto today. She notes that she began having some pain with inspiration in the right lower part of her chest. She saw her PCP who ordered a CTA as an outpatient. The patient was found to have pulmonary emboli in the right lower and middle lobes. She presents now for evaluation.  Her vital signs are unremarkable here. She has minimal pain on exam and does not want pain medicine. She does not feel sob. I feel that outpatient treatment would still be reasonable. I do not think she has failed Xarelto as she only took 1 dose yesterday and did not take it today. I offered admission for observation versus continuing Xarelto and follow-up with her physician. The  patient is going to think about it. In the meantime will get i-STAT troponin, EKG, and pregnancy.  12:02 AM: I interpreted/reviewed the labs and/or imaging which were non-contributory.  Pt continues to appear well. Would prefer to go home. Given unremarkable VS, stable appearance, minimal to no pain, this is reasonable. Pt got this evenings dose of xarelto. Informed to pick up the Rx in the morning. I have discussed the diagnosis/risks/treatment options with the patient and family and believe the pt to be eligible for discharge home to follow-up with her pcp. We also discussed returning to the ED immediately if new or worsening sx occur. We discussed the sx which are most concerning (e.g., worsening pain, sob) that necessitate immediate return. Medications administered to the patient during their visit and any new prescriptions provided to the patient are listed below.  Medications given during this visit Medications  Rivaroxaban (XARELTO) tablet 15 mg (15 mg Oral Given 09/04/14 2155)    New Prescriptions   OXYCODONE-ACETAMINOPHEN (  PERCOCET) 5-325 MG PER TABLET    Take 1 tablet by mouth every 6 (six) hours as needed for moderate pain.      Purvis SheffieldForrest Sam Wunschel, MD 09/05/14 0003

## 2014-09-04 NOTE — ED Notes (Signed)
Dr Harrison at bedside

## 2014-09-05 LAB — CARDIOLIPIN ANTIBODIES, IGG, IGM, IGA
Anticardiolipin IgA: <9 APL U/mL (ref 0–11)
Anticardiolipin IgM: <9 MPL U/mL (ref 0–12)

## 2014-09-05 LAB — BETA-2-GLYCOPROTEIN I ABS, IGG/M/A
Beta-2 Glyco I IgG: <9 GPI IgG units (ref 0–20)
Beta-2-Glycoprotein I IgM: <9 GPI IgM units (ref 0–32)

## 2014-09-05 LAB — PROTEIN C, TOTAL: Protein C, Total: 75 % (ref 70–140)

## 2014-09-05 MED ORDER — OXYCODONE-ACETAMINOPHEN 5-325 MG PO TABS: 1.0000 | ORAL_TABLET | Freq: Four times a day (QID) | ORAL | Status: DC | PRN

## 2014-09-06 LAB — PROTEIN S ACTIVITY: PROTEIN S ACTIVITY: 72 % (ref 60–145)

## 2014-09-06 LAB — PROTEIN S, TOTAL: Protein S Ag, Total: 113 % (ref 58–150)

## 2014-09-06 LAB — PROTEIN C ACTIVITY: PROTEIN C ACTIVITY: 90 % (ref 74–151)

## 2014-09-07 LAB — LUPUS ANTICOAGULANT PANEL
DRVVT: 50.5 s (ref 0.0–55.1)
PTT Lupus Anticoagulant: 35.7 s (ref 0.0–50.0)

## 2014-09-07 LAB — FACTOR 5 LEIDEN

## 2014-09-08 LAB — PROTHROMBIN GENE MUTATION

## 2015-04-30 ENCOUNTER — Emergency Department (EMERGENCY_DEPARTMENT_HOSPITAL)
Admit: 2015-04-30 | Discharge: 2015-04-30 | Disposition: A | Discharge status: Home / Self Care | Payer: BLUE CROSS/BLUE SHIELD | Attending: Emergency Medicine | Admitting: Emergency Medicine

## 2015-04-30 ENCOUNTER — Encounter (HOSPITAL_COMMUNITY): Payer: Self-pay | Admitting: *Deleted

## 2015-04-30 ENCOUNTER — Emergency Department (HOSPITAL_COMMUNITY)
Admission: EM | Admit: 2015-04-30 | Discharge: 2015-04-30 | Disposition: A | Discharge status: Home / Self Care | Payer: BLUE CROSS/BLUE SHIELD | Attending: Emergency Medicine | Admitting: Emergency Medicine

## 2015-04-30 DIAGNOSIS — Z87891 Personal history of nicotine dependence: Secondary | ICD-10-CM | POA: Diagnosis not present

## 2015-04-30 DIAGNOSIS — Z791 Long term (current) use of non-steroidal anti-inflammatories (NSAID): Secondary | ICD-10-CM | POA: Insufficient documentation

## 2015-04-30 DIAGNOSIS — I824Z2 Acute embolism and thrombosis of unspecified deep veins of left distal lower extremity: Secondary | ICD-10-CM | POA: Diagnosis not present

## 2015-04-30 DIAGNOSIS — Z792 Long term (current) use of antibiotics: Secondary | ICD-10-CM | POA: Insufficient documentation

## 2015-04-30 DIAGNOSIS — M7989 Other specified soft tissue disorders: Secondary | ICD-10-CM

## 2015-04-30 DIAGNOSIS — Z86711 Personal history of pulmonary embolism: Secondary | ICD-10-CM | POA: Insufficient documentation

## 2015-04-30 DIAGNOSIS — Z3202 Encounter for pregnancy test, result negative: Secondary | ICD-10-CM | POA: Diagnosis not present

## 2015-04-30 DIAGNOSIS — Z86718 Personal history of other venous thrombosis and embolism: Secondary | ICD-10-CM | POA: Diagnosis not present

## 2015-04-30 DIAGNOSIS — I82402 Acute embolism and thrombosis of unspecified deep veins of left lower extremity: Secondary | ICD-10-CM

## 2015-04-30 DIAGNOSIS — M79672 Pain in left foot: Secondary | ICD-10-CM | POA: Diagnosis present

## 2015-04-30 LAB — PREGNANCY, URINE: Preg Test, Ur: NEGATIVE

## 2015-04-30 NOTE — ED Provider Notes (Signed)
CSN: [id number]     Arrival date & time 04/30/15  1191 History   First MD Initiated Contact with Patient 04/30/15 [phone]     Chief Complaint  Patient presents with  . Foot Pain    right foot      HPI  She presents for evaluation of foot swelling. History of a DVT diagnosed July of last year. She was on oral contraceptives and smoking at the time. Was on Xarelto for 3 months. Symptoms and swelling resolved. Is still nonsmoker. Is not on any hormone therapy now. For about 1 week as noticed some minimal discomfort of her left foot. States it is "sore" walking. However she noted some swelling around her ankle and presents here out of concern that she may have recurrent DVT.  Past Medical History  Diagnosis Date  . DVT (deep venous thrombosis) (HCC)   . PE (pulmonary embolism)    Past Surgical History  Procedure Laterality Date  . Back surgery    . Orif ankle fracture Left 12/11/2013    Procedure: OPEN REDUCTION INTERNAL FIXATION (ORIF) LEFT  ANKLE AND SYNDESMOSIS;  Surgeon: Budd Palmer, MD;  Location: [facility] OR;  Service: Orthopedics;  Laterality: Left;   History reviewed. No pertinent family history. Social History  Substance Use Topics  . Smoking status: Former Games developer  . Smokeless tobacco: Never Used  . Alcohol Use: Yes     Comment: occasionally   OB History    No data available     Review of Systems  Constitutional: Negative for fever, chills, diaphoresis, appetite change and fatigue.  HENT: Negative for mouth sores, sore throat and trouble swallowing.   Eyes: Negative for visual disturbance.  Respiratory: Negative for cough, chest tightness, shortness of breath and wheezing.   Cardiovascular: Negative for chest pain.  Gastrointestinal: Negative for nausea, vomiting, abdominal pain, diarrhea and abdominal distention.  Endocrine: Negative for polydipsia, polyphagia and polyuria.  Genitourinary: Negative for dysuria, frequency and hematuria.  Musculoskeletal: Negative for gait  problem.       Left lower extremity pain and swelling  Skin: Negative for color change, pallor and rash.  Neurological: Negative for dizziness, syncope, light-headedness and headaches.  Hematological: Does not bruise/bleed easily.  Psychiatric/Behavioral: Negative for behavioral problems and confusion.      Allergies  Hydrocodone-acetaminophen and Ibuprofen  Home Medications   Prior to Admission medications   Medication Sig Start Date End Date Taking? Authorizing Provider  clindamycin (CLEOCIN T) 1 % external solution Apply 1 application topically daily. 04/23/15  Yes Historical Provider, MD  naproxen sodium (ANAPROX) 220 MG tablet Take 220 mg by mouth 2 (two) times daily with a meal.   Yes Historical Provider, MD  oxyCODONE-acetaminophen (PERCOCET) 5-325 MG per tablet Take 1 tablet by mouth every 6 (six) hours as needed for moderate pain. Patient not taking: Reported on 04/30/2015 09/05/14   Purvis Sheffield, MD  XARELTO STARTER PACK 15 & 20 MG TBPK Take 15-20 mg by mouth as directed. Take as directed on package: Start with one  tablet by mouth twice a day with food. On Day 22, switch to one  tablet once a day with food. 09/03/14   Mercedes Camprubi-Soms, PA-C   BP 114/86 mmHg  Pulse 75  Temp(Src) 98.5 F (36.9 C) (Oral)  Resp 18  Ht  (1.651 m)  Wt 184 lb (83.462 kg)  BMI 30.62 kg/m2  SpO2 100%  LMP 04/05/2015 (Exact Date) Physical Exam  Constitutional: She is oriented to person, place, and  time. She appears well-developed and well-nourished. No distress.  HENT:  Head: Normocephalic.  Eyes: Conjunctivae are normal. Pupils are equal, round, and reactive to light. No scleral icterus.  Neck: Normal range of motion. Neck supple. No thyromegaly present.  Cardiovascular: Normal rate and regular rhythm.  Exam reveals no gallop and no friction rub.   No murmur heard. Pulmonary/Chest: Effort normal and breath sounds normal. No respiratory distress. She has no wheezes. She has  no rales.  Abdominal: Soft. Bowel sounds are normal. She exhibits no distension. There is no tenderness. There is no rebound.  Musculoskeletal: Normal range of motion.       Legs: Neurological: She is alert and oriented to person, place, and time.  Skin: Skin is warm and dry. No rash noted.  Psychiatric: She has a normal mood and affect. Her behavior is normal.    ED Course  Procedures (including critical care time) Labs Review Labs Reviewed  PREGNANCY, URINE    Imaging Review No results found. I have personally reviewed and evaluated these images and lab results as part of my medical decision-making.   EKG Interpretation None      MDM   Final diagnoses:  DVT (deep venous thrombosis), left   Vascular ultrasound results as follows.  Vascular Ultrasound Left lower extremity venous duplex has been completed. Preliminary findings: Appears to be subacute vs chronic DVT involving the left mid femoral vein and the left proximal popliteal vein. Also, subacute vs chronic SVT involving the left lesser saphenous vein.   Had a long discussion with the patient. She did not have follow-up ultrasound after her previous DVT in July of last year. We discussed the possibilities being recurrent, versus remnant nonocclusive DVT from her previous clot. She states that she has had intermittent swelling if she is on her feet for prolonged period of time. However, she has noticed this more so in the last 1 week.  Discussed Xarelto versus aspirin. I think either would be an appropriate choice with early follow-up. She states she had quite heavy periods while on Xarelto and strongly prefers aspirin only. Asked her to follow-up with her primary care physician within the next 3-4 weeks for repeat ultrasound to ensure that the clot is not becoming more occlusive or acute. She is understanding this was discharged in stable condition.    Rolland PorterMark Avyon Herendeen, MD 04/30/15 1000

## 2015-04-30 NOTE — Progress Notes (Signed)
*  PRELIMINARY RESULTS* Vascular Ultrasound Left lower extremity venous duplex has been completed.  Preliminary findings: Appears to be subacute vs chronic DVT involving the left mid femoral vein and the left proximal popliteal vein. Also, subacute vs chronic SVT involving the left lesser saphenous vein.   Farrel DemarkJill Eunice, RDMS, RVT  04/30/2015, 9:31 AM

## 2015-04-30 NOTE — ED Notes (Signed)
Patient is alert and oriented x4.  She is complaining of right foot pain that is accompanied by pain. Currently she rates her pain 8 of 10.  She describes the pain at a spasm type of pain. Patient has a  History of DVTs and wanted to get checked out.

## 2015-04-30 NOTE — Discharge Instructions (Signed)
Aspirin, 325 mg daily. Compression hose to limit swelling. Follow-up with your physician within the next 4 weeks to discuss repeat ultrasound. Ultrasound today shows an complete blockage of the veins due to clot. Clot could be recurring, or could be left over from your previous acute DVT. Recheck here with any worsening swelling of your leg, chest pain, difficulty breathing, dizziness, or other new or worsening symptoms.    Deep Vein Thrombosis A deep vein thrombosis (DVT) is a blood clot (thrombus) that usually occurs in a deep, larger vein of the lower leg or the pelvis, or in an upper extremity such as the arm. These are dangerous and can lead to serious and even life-threatening complications if the clot travels to the lungs. A DVT can damage the valves in your leg veins so that instead of flowing upward, the blood pools in the lower leg. This is called post-thrombotic syndrome, and it can result in pain, swelling, discoloration, and sores on the leg. CAUSES A DVT is caused by the formation of a blood clot in your leg, pelvis, or arm. Usually, several things contribute to the formation of blood clots. A clot may develop when:  Your blood flow slows down.  Your vein becomes damaged in some way.  You have a condition that makes your blood clot more easily. RISK FACTORS A DVT is more likely to develop in:  People who are older, especially over 35 years of age.  People who are overweight (obese).  People who sit or lie still for a long time, such as during long-distance travel (over 4 hours), bed rest, hospitalization, or during recovery from certain medical conditions like a stroke.  People who do not engage in much physical activity (sedentary lifestyle).  People who have chronic breathing disorders.  People who have a personal or family history of blood clots or blood clotting disease.  People who have peripheral vascular disease (PVD), diabetes, or some types of  cancer.  People who have heart disease, especially if the person had a recent heart attack or has congestive heart failure.  People who have neurological diseases that affect the legs (leg paresis).  People who have had a traumatic injury, such as breaking a hip or leg.  People who have recently had major or lengthy surgery, especially on the hip, knee, or abdomen.  People who have had a central line placed inside a large vein.  People who take medicines that contain the hormone estrogen. These include birth control pills and hormone replacement therapy.  Pregnancy or during childbirth or the postpartum period.  Long plane flights (over 8 hours). SIGNS AND SYMPTOMS Symptoms of a DVT can include:   Swelling of your leg or arm, especially if one side is much worse.  Warmth and redness of your leg or arm, especially if one side is much worse.  Pain in your arm or leg. If the clot is in your leg, symptoms may be more noticeable or worse when you stand or walk.  A feeling of pins and needles, if the clot is in the arm. The symptoms of a DVT that has traveled to the lungs (pulmonary embolism, PE) usually start suddenly and include:  Shortness of breath while active or at rest.  Coughing or coughing up blood or blood-tinged mucus.  Chest pain that is often worse with deep breaths.  Rapid or irregular heartbeat.  Feeling light-headed or dizzy.  Fainting.  Feeling anxious.  Sweating. There may also be pain and swelling in  a leg if that is where the blood clot started. These symptoms may represent a serious problem that is an emergency. Do not wait to see if the symptoms will go away. Get medical help right away. Call your local emergency services (911 in the [place] Do not drive yourself to the hospital. DIAGNOSIS Your health care provider will take a medical history and perform a physical exam. You may also have other tests, including:  Blood tests to assess the clotting  properties of your blood.  Imaging tests, such as CT, ultrasound, MRI, X-ray, and other tests to see if you have clots anywhere in your body. TREATMENT After a DVT is identified, it can be treated. The type of treatment that you receive depends on many factors, such as the cause of your DVT, your risk for bleeding or developing more clots, and other medical conditions that you have. Sometimes, a combination of treatments is necessary. Treatment options may be combined and include:  Monitoring the blood clot with ultrasound.  Taking medicines by mouth, such as newer blood thinners (anticoagulants), thrombolytics, or warfarin.  Taking anticoagulant medicine by injection or through an IV tube.  Wearing compression stockings or using different types ofdevices.  Surgery (rare) to remove the blood clot or to place a filter in your abdomen to stop the blood clot from traveling to your lungs. Treatments for a DVT are often divided into immediate treatment and long-term treatment (up to 3 months after DVT). You can work with your health care provider to choose the treatment program that is best for you. HOME CARE INSTRUCTIONS If you are taking a newer oral anticoagulant:  Take the medicine every single day at the same time each day.  Understand what foods and drugs interact with this medicine.  Understand that there are no regular blood tests required when using this medicine.  Understand the side effects of this medicine, including excessive bruising or bleeding. Ask your health care provider or pharmacist about other possible side effects. If you are taking warfarin:  Understand how to take warfarin and know which foods can affect how warfarin works in Public relations account executive.  Understand that it is dangerous to take too much or too little warfarin. Too much warfarin increases the risk of bleeding. Too little warfarin continues to allow the risk for blood clots.  Follow your PT and INR blood testing  schedule. The PT and INR results allow your health care provider to adjust your dose of warfarin. It is very important that you have your PT and INR tested as often as told by your health care provider.  Avoid major changes in your diet, or tell your health care provider before you change your diet. Arrange a visit with a registered dietitian to answer your questions. Many foods, especially foods that are high in vitamin K, can interfere with warfarin and affect the PT and INR results. Eat a consistent amount of foods that are high in vitamin K, such as:  Spinach, kale, broccoli, cabbage, collard greens, turnip greens, Brussels sprouts, peas, cauliflower, seaweed, and parsley.  Beef liver and pork liver.  Green tea.  Soybean oil.  Tell your health care provider about any and all medicines, vitamins, and supplements that you take, including aspirin and other over-the-counter anti-inflammatory medicines. Be especially cautious with aspirin and anti-inflammatory medicines. Do not take those before you ask your health care provider if it is safe to do so. This is important because many medicines can interfere with warfarin and affect  the PT and INR results.  Do not start or stop taking any over-the-counter or prescription medicine unless your health care provider or pharmacist tells you to do so. If you take warfarin, you will also need to do these things:  Hold pressure over cuts for longer than usual.  Tell your dentist and other health care providers that you are taking warfarin before you have any procedures in which bleeding may occur.  Avoid alcohol or drink very small amounts. Tell your health care provider if you change your alcohol intake.  Do not use tobacco products, including cigarettes, chewing tobacco, and e-cigarettes. If you need help quitting, ask your health care provider.  Avoid contact sports. General Instructions  Take over-the-counter and prescription medicines only as  told by your health care provider. Anticoagulant medicines can have side effects, including easy bruising and difficulty stopping bleeding. If you are prescribed an anticoagulant, you will also need to do these things:  Hold pressure over cuts for longer than usual.  Tell your dentist and other health care providers that you are taking anticoagulants before you have any procedures in which bleeding may occur.  Avoid contact sports.  Wear a medical alert bracelet or carry a medical alert card that says you have had a PE.  Ask your health care provider how soon you can go back to your normal activities. Stay active to prevent new blood clots from forming.  Make sure to exercise while traveling or when you have been sitting or standing for a long period of time. It is very important to exercise. Exercise your legs by walking or by tightening and relaxing your leg muscles often. Take frequent walks.  Wear compression stockings as told by your health care provider to help prevent more blood clots from forming.  Do not use tobacco products, including cigarettes, chewing tobacco, and e-cigarettes. If you need help quitting, ask your health care provider.  Keep all follow-up appointments with your health care provider. This is important. PREVENTION Take these actions to decrease your risk of developing another DVT:  Exercise regularly. For at least 30 minutes every day, engage in:  Activity that involves moving your arms and legs.  Activity that encourages good blood flow through your body by increasing your heart rate.  Exercise your arms and legs every hour during long-distance travel (over 4 hours). Drink plenty of water and avoid drinking alcohol while traveling.  Avoid sitting or lying in bed for long periods of time without moving your legs.  Maintain a weight that is appropriate for your height. Ask your health care provider what weight is healthy for you.  If you are a woman who is  over 32 years of age, avoid unnecessary use of medicines that contain estrogen. These include birth control pills.  Do not smoke, especially if you take estrogen medicines. If you need help quitting, ask your health care provider. If you are hospitalized, prevention measures may include:  Early walking after surgery, as soon as your health care provider says that it is safe.  Receiving anticoagulants to prevent blood clots.If you cannot take anticoagulants, other options may be available, such as wearing compression stockings or using different types of devices. SEEK IMMEDIATE MEDICAL CARE IF:  You have new or increased pain, swelling, or redness in an arm or leg.  You have numbness or tingling in an arm or leg.  You have shortness of breath while active or at rest.  You have chest pain.  You have  a rapid or irregular heartbeat.  You feel light-headed or dizzy.  You cough up blood.  You notice blood in your vomit, bowel movement, or urine. These symptoms may represent a serious problem that is an emergency. Do not wait to see if the symptoms will go away. Get medical help right away. Call your local emergency services (911 in the [place] Do not drive yourself to the hospital.   This information is not intended to replace advice given to you by your health care provider. Make sure you discuss any questions you have with your health care provider.   Document Released: 02/13/2005 Document Revised: 11/04/2014 Document Reviewed: 06/10/2014 Elsevier Interactive Patient Education [employer]2016 [employer] Inc.

## 2015-05-01 ENCOUNTER — Emergency Department (HOSPITAL_COMMUNITY)
Admission: EM | Admit: 2015-05-01 | Discharge: 2015-05-01 | Disposition: A | Discharge status: Home / Self Care | Payer: BLUE CROSS/BLUE SHIELD | Attending: Emergency Medicine | Admitting: Emergency Medicine

## 2015-05-01 ENCOUNTER — Encounter (HOSPITAL_COMMUNITY): Payer: Self-pay

## 2015-05-01 ENCOUNTER — Emergency Department (HOSPITAL_BASED_OUTPATIENT_CLINIC_OR_DEPARTMENT_OTHER)
Admit: 2015-05-01 | Discharge: 2015-05-01 | Disposition: A | Discharge status: Home / Self Care | Payer: BLUE CROSS/BLUE SHIELD | Attending: Emergency Medicine | Admitting: Emergency Medicine

## 2015-05-01 ENCOUNTER — Emergency Department (HOSPITAL_COMMUNITY): Payer: BLUE CROSS/BLUE SHIELD

## 2015-05-01 DIAGNOSIS — Z86711 Personal history of pulmonary embolism: Secondary | ICD-10-CM | POA: Insufficient documentation

## 2015-05-01 DIAGNOSIS — Z792 Long term (current) use of antibiotics: Secondary | ICD-10-CM | POA: Diagnosis not present

## 2015-05-01 DIAGNOSIS — Z7901 Long term (current) use of anticoagulants: Secondary | ICD-10-CM | POA: Insufficient documentation

## 2015-05-01 DIAGNOSIS — R0602 Shortness of breath: Secondary | ICD-10-CM | POA: Diagnosis not present

## 2015-05-01 DIAGNOSIS — I82402 Acute embolism and thrombosis of unspecified deep veins of left lower extremity: Secondary | ICD-10-CM | POA: Diagnosis not present

## 2015-05-01 DIAGNOSIS — Z86718 Personal history of other venous thrombosis and embolism: Secondary | ICD-10-CM | POA: Diagnosis not present

## 2015-05-01 DIAGNOSIS — Z791 Long term (current) use of non-steroidal anti-inflammatories (NSAID): Secondary | ICD-10-CM | POA: Insufficient documentation

## 2015-05-01 DIAGNOSIS — Z87891 Personal history of nicotine dependence: Secondary | ICD-10-CM | POA: Insufficient documentation

## 2015-05-01 LAB — BASIC METABOLIC PANEL
Anion gap: 9 (ref 5–15)
BUN: 14 mg/dL (ref 6–20)
CHLORIDE: 102 mmol/L (ref 101–111)
CO2: 27 mmol/L (ref 22–32)
Calcium: 9.3 mg/dL (ref 8.9–10.3)
Creatinine, Ser: 0.86 mg/dL (ref 0.44–1.00)
GFR calc non Af Amer: >60 mL/min (ref >60)
Glucose, Bld: 94 mg/dL (ref 65–99)
POTASSIUM: 3.9 mmol/L (ref 3.5–5.1)
Sodium: 138 mmol/L (ref 135–145)

## 2015-05-01 LAB — CBC WITH DIFFERENTIAL/PLATELET
Basophils Absolute: 0 10*3/uL (ref 0.0–0.1)
Basophils Relative: 0 %
Eosinophils Absolute: 0.1 10*3/uL (ref 0.0–0.7)
Eosinophils Relative: 1 %
HCT: 40.5 % (ref 36.0–46.0)
HEMOGLOBIN: 13.7 g/dL (ref 12.0–15.0)
LYMPHS PCT: 44 %
Lymphs Abs: 2.4 10*3/uL (ref 0.7–4.0)
MCH: 28.3 pg (ref 26.0–34.0)
MCHC: 33.8 g/dL (ref 30.0–36.0)
MCV: 83.7 fL (ref 78.0–100.0)
Monocytes Absolute: 0.4 10*3/uL (ref 0.1–1.0)
Monocytes Relative: 7 %
Neutro Abs: 2.6 10*3/uL (ref 1.7–7.7)
Neutrophils Relative %: 48 %
Platelets: 322 10*3/uL (ref 150–400)
RBC: 4.84 MIL/uL (ref 3.87–5.11)
RDW: 14 % (ref 11.5–15.5)
WBC: 5.5 10*3/uL (ref 4.0–10.5)

## 2015-05-01 LAB — APTT: aPTT: 27 seconds (ref 24–37)

## 2015-05-01 LAB — PROTIME-INR
INR: 1.02 (ref 0.00–1.49)
Prothrombin Time: 13.6 seconds (ref 11.6–15.2)

## 2015-05-01 MED ORDER — ACETAMINOPHEN 325 MG PO TABS
650.0000 mg | ORAL_TABLET | Freq: Once | ORAL | Status: AC
  Administered 2015-05-01: 650 mg via ORAL
  Filled 2015-05-01: qty 2

## 2015-05-01 MED ORDER — ASPIRIN EC 325 MG PO TBEC
325.0000 mg | DELAYED_RELEASE_TABLET | Freq: Once | ORAL | Status: AC
  Administered 2015-05-01: 325 mg via ORAL
  Filled 2015-05-01: qty 1

## 2015-05-01 MED ORDER — IOHEXOL 350 MG/ML SOLN
100.0000 mL | Freq: Once | INTRAVENOUS | Status: AC | PRN
  Administered 2015-05-01: 100 mL via INTRAVENOUS

## 2015-05-01 NOTE — ED Notes (Signed)
She states she was seen here yesterday for left lower extremity discomfort and underwent doppler study.  She c/o uri sx plus some mild "sparks of pain" (points at upper chest and head).  Her skin is normal, warm and dry and she is breathing normally.

## 2015-05-01 NOTE — Discharge Instructions (Signed)
You were seen in the ER today for evaluation of shortness of breath. Your CT scan and labs were normal. You do not show evidence of a blood clot in your lungs. We also got an ultrasound of your right leg and that was negative as well. Please continue taking your aspirin daily as prescribed and follow up with your primary care provider as scheduled. Return to the ER for new or worsening symptoms.

## 2015-05-01 NOTE — Progress Notes (Signed)
Preliminary results by tech - Right lower ext. Venous duplex completed. Negative for deep and superficial vein thrombosis in the right leg. Dewanda Fennema, BS, RDMS, RVT  

## 2015-05-01 NOTE — ED Provider Notes (Signed)
CSN: [id number]     Arrival date & time 05/01/15  1241 History   First MD Initiated Contact with Patient 05/01/15 1553     Chief Complaint  Patient presents with  . Foot Pain  . URI    HPI  Marie Cruz is an 30 y.o. 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 Tx options were discussed and pt started on daily ASA therapy with 1 month f/u with PCP. Pt states that she talked to her PCP earlier today via telephone and mentioned that she had been feeling increased DOE and SOB for the past two weeks and her PCP told her to come back to the ED for PE rule out. Pt states that for the past two weeks she has noticed she gets short of breath while having a lengthy conversation or on physical exertion such as walking up the stairs. However, she states now she notices increased SOB even randomly while at rest. During our conversation she does not exhibit overt increased WOB though does pause for a second mid-sentence a couple of times. Pt reports that today she not only continues to have left calf pain and ankle swelling but has noticed pain in her right calf as well, which was not imaged yesterday. She states the pain is worse with weight bearing. Denies new numbness, weakness, tingling. States she has not taken her daily ASA today yet. Denies cough, fever, chills. She does report she felt dizzy/lightheaded while walking into the room in the ED earlier today. Denies lightheadedness/dizziness now. She also reports new "pinching" feelings in the left side of her chest as well as in her frontal and temporal region that are new today. Denies photophobia, phonophobia, blurred vision, hearing changes.  Past Medical History  Diagnosis Date  . DVT (deep venous thrombosis)  (HCC)   . PE (pulmonary embolism)    Past Surgical History  Procedure Laterality Date  . Back surgery    . Orif ankle fracture Left 12/11/2013    Procedure: OPEN REDUCTION INTERNAL FIXATION (ORIF) LEFT  ANKLE AND SYNDESMOSIS;  Surgeon: Budd Palmer, MD;  Location: [facility] OR;  Service: Orthopedics;  Laterality: Left;   No family history on file. Social History  Substance Use Topics  . Smoking status: Former Games developer  . Smokeless tobacco: Never Used  . Alcohol Use: Yes     Comment: occasionally   OB History    No data available     Review of Systems  All other systems reviewed and are negative.     Allergies  Hydrocodone-acetaminophen and Ibuprofen  Home Medications   Prior to Admission medications   Medication Sig Start Date End Date Taking? Authorizing Provider  clindamycin (CLEOCIN T) 1 % external solution Apply 1 application topically daily. 04/23/15   Historical Provider, MD  naproxen sodium (ANAPROX) 220 MG tablet Take 220 mg by mouth 2 (two) times daily with a meal.    Historical Provider, MD  oxyCODONE-acetaminophen (PERCOCET) 5-325 MG per tablet Take 1 tablet by mouth every 6 (six) hours as needed for moderate pain. Patient not taking: Reported on 04/30/2015 09/05/14   Purvis Sheffield, MD  XARELTO STARTER PACK 15 & 20 MG TBPK Take 15-20 mg by mouth as directed.  Take as directed on package: Start with one 15mg  tablet by mouth twice a day with food. On Day 22, switch to one 20mg  tablet once a day with food. 09/03/14   Mercedes Camprubi-Soms, PA-C   BP 127/90 mmHg  Pulse 86  Temp(Src) 98.3 F (36.8 C) (Oral)  Resp 18  SpO2 99%  LMP 04/05/2015 (Exact Date) Physical Exam  Constitutional: She is oriented to person, place, and time. No distress.  HENT:  Right Ear: External ear normal.  Left Ear: External ear normal.  Nose: Nose normal.  Mouth/Throat: Oropharynx is clear and moist. No oropharyngeal exudate.  Eyes: Conjunctivae and EOM are normal. Pupils are equal, round,  and reactive to light.  Neck: Normal range of motion. Neck supple.  Cardiovascular: Normal rate, regular rhythm, normal heart sounds and intact distal pulses.   Pulmonary/Chest: Breath sounds normal. No respiratory distress. She has no wheezes. She exhibits no tenderness.  No increased WOB at rest though does pause mid-sentence a couple times.   Abdominal: Soft. Bowel sounds are normal. She exhibits no distension. There is no tenderness.  Musculoskeletal:  Left ankle mildly edematous. Bilateral calf tenderness. No posterior popliteal or thigh tenderness. 1-2+ distal pulses.   Neurological: She is alert and oriented to person, place, and time.  Skin: Skin is warm and dry. She is not diaphoretic.  Psychiatric: She has a normal mood and affect.  Nursing note and vitals reviewed.   ED Course  Procedures (including critical care time) Labs Review Labs Reviewed  BASIC METABOLIC PANEL  CBC WITH DIFFERENTIAL/PLATELET  APTT  PROTIME-INR    Imaging Review Ct Angio Chest Pe W/cm &/or Wo Cm  05/01/2015  CLINICAL DATA:  30 year old female with central chest pain and shortness of breath EXAM: CT ANGIOGRAPHY CHEST WITH CONTRAST TECHNIQUE: Multidetector CT imaging of the chest was performed using the standard protocol during bolus administration of intravenous contrast. Multiplanar CT image reconstructions and MIPs were obtained to evaluate the vascular anatomy. CONTRAST:  100mL OMNIPAQUE IOHEXOL 350 MG/ML SOLN COMPARISON:  None. FINDINGS: The lungs are clear. There is no pleural effusion or pneumothorax. The central airways are patent. The thoracic aorta appears unremarkable. There is no CT evidence of pulmonary embolism. There is no cardiomegaly or pericardial effusion. No hilar or mediastinal adenopathy the the esophagus and the thyroid gland appear grossly unremarkable. There is no axillary adenopathy. The chest wall soft tissues appear unremarkable. The osseous structures are intact the The visualized  upper abdomen is unremarkable. Review of the MIP images confirms the above findings. IMPRESSION: No acute intrathoracic pathology. No CT evidence of pulmonary embolism. Electronically Signed   By: Elgie CollardArash  Radparvar M.D.   On: 05/01/2015 18:52   I have personally reviewed and evaluated these images and lab results as part of my medical decision-making.   EKG Interpretation   Date/Time:  Saturday May 01 2015 12:53:54 EST Ventricular Rate:  79 PR Interval:  174 QRS Duration: 87 QT Interval:  361 QTC Calculation: 414 R Axis:   50 Text Interpretation:  Sinus rhythm Probable left atrial enlargement  Borderline T abnormalities, anterior leads . new since last tracing  Confirmed by KNAPP  MD-J, JON [phone]) on 05/01/2015 1:02:01 PM      MDM   Final diagnoses:  Shortness of breath  DVT, lower extremity, left    Given history will get CTA PE and DVT study of right leg (yesterday's study only visualized left leg). Basic labs and home ASA ordered as well.   DVT study  negative in right leg. CTA PE with no evidence of PE or other acute abnormality. Pt able to ambulate asymptomatically with no VS changes. Will d/c with PCP f/u. Instructed to continue ASA as discussed at yesterday's visit. ER return precautions given.  Carlene Coria, PA-C 05/01/15 2040  Lyndal Pulley, MD 05/01/15 [phone]

## 2015-05-01 NOTE — ED Notes (Signed)
LL foot swelling and darkening of foot noted.

## 2015-09-30 ENCOUNTER — Encounter (HOSPITAL_COMMUNITY): Payer: Self-pay | Admitting: Emergency Medicine

## 2015-09-30 ENCOUNTER — Emergency Department (HOSPITAL_COMMUNITY)
Admission: EM | Admit: 2015-09-30 | Discharge: 2015-09-30 | Disposition: A | Discharge status: Home / Self Care | Payer: BLUE CROSS/BLUE SHIELD | Attending: Emergency Medicine | Admitting: Emergency Medicine

## 2015-09-30 DIAGNOSIS — Z79899 Other long term (current) drug therapy: Secondary | ICD-10-CM | POA: Diagnosis not present

## 2015-09-30 DIAGNOSIS — F17200 Nicotine dependence, unspecified, uncomplicated: Secondary | ICD-10-CM | POA: Insufficient documentation

## 2015-09-30 DIAGNOSIS — R51 Headache: Secondary | ICD-10-CM | POA: Insufficient documentation

## 2015-09-30 DIAGNOSIS — R519 Headache, unspecified: Secondary | ICD-10-CM

## 2015-09-30 LAB — CARBOXYHEMOGLOBIN
CARBOXYHEMOGLOBIN: 1.1 % (ref 0.5–1.5)
Methemoglobin: 0.7 % (ref 0.0–1.5)
O2 SAT: 59.1 %
Total hemoglobin: 12.5 g/dL (ref 12.0–16.0)

## 2015-09-30 NOTE — ED Provider Notes (Addendum)
[facility]-EMERGENCY DEPT Provider Note   CSN: [id number] Arrival date & time: 09/30/15  4098  First Provider Contact:  None       History   Chief Complaint Chief Complaint  Patient presents with  . Other    Requesting "carbon levels check"     HPI Marie Cruz is a 30 y.o. female.  HPI  Pt comes in with cc of headache and possible CO exposure. Pt reports that she just opened her new mattress, and she smelled some chemical odor. Soon after she had headache and nausea, and her smoke detector went off, so she decided to come in to ensure there was no CO poisoning. Pt describes the  Headache as a mild headache and doesn't want any meds for it right now. No nausea, vomiting, visual complains, seizures, altered mental status, loss of consciousness, new weakness, or numbness, no gait instability.   Past Medical History:  Diagnosis Date  . DVT (deep venous thrombosis) (HCC)   . PE (pulmonary embolism)     Patient Active Problem List   Diagnosis Date Noted  . Ankle fracture, left 12/11/2013  . Syndesmotic disruption of left ankle 12/11/2013    Past Surgical History:  Procedure Laterality Date  . BACK SURGERY    . ORIF ANKLE FRACTURE Left 12/11/2013   Procedure: OPEN REDUCTION INTERNAL FIXATION (ORIF) LEFT  ANKLE AND SYNDESMOSIS;  Surgeon: Budd Palmer, MD;  Location: [facility] OR;  Service: Orthopedics;  Laterality: Left;    OB History    No data available       Home Medications    Prior to Admission medications   Medication Sig Start Date End Date Taking? Authorizing Provider  ferrous sulfate 325 (65 FE) MG tablet Take 325 mg by mouth 3 (three) times daily with meals.   Yes Historical Provider, MD  naproxen sodium (ANAPROX) 220 MG tablet Take 220 mg by mouth every 12 (twelve) hours as needed (for pain).    Yes Historical Provider, MD    Family History No family history on file.  Social History Social History  Substance Use Topics  . Smoking status: Former  Games developer  . Smokeless tobacco: Never Used  . Alcohol use Yes     Comment: occasionally     Allergies   Hydrocodone-acetaminophen and Ibuprofen   Review of Systems Review of Systems  Constitutional: Negative for activity change.  Eyes: Negative for photophobia.  Neurological: Positive for headaches. Negative for dizziness.     Physical Exam Updated Vital Signs BP 110/78   Pulse 61   Temp 98.3 F (36.8 C) (Oral)   Resp 17   Ht  (1.651 m)   Wt 178 lb 5 oz (80.9 kg)   LMP 09/03/2015 (Approximate)   SpO2 100%   BMI 29.67 kg/m   Physical Exam  Constitutional: She is oriented to person, place, and time. She appears well-developed.  HENT:  Head: Normocephalic and atraumatic.  Eyes: EOM are normal.  Neck: Normal range of motion. Neck supple.  Cardiovascular: Normal rate.   Pulmonary/Chest: Effort normal.  Abdominal: Bowel sounds are normal.  Neurological: She is alert and oriented to person, place, and time.  Skin: Skin is warm and dry.  Nursing note and vitals reviewed.    ED Treatments / Results  Labs (all labs ordered are listed, but only abnormal results are displayed) Labs Reviewed  CARBOXYHEMOGLOBIN    EKG  EKG Interpretation None       Radiology No results found.  Procedures Procedures (  including critical care time)  Medications Ordered in ED Medications - No data to display   Initial Impression / Assessment and Plan / ED Course  I have reviewed the triage vital signs and the nursing notes.  Pertinent labs & imaging results that were available during my care of the patient were reviewed by me and considered in my medical decision making (see chart for details).  Clinical Course   Pt comes in with cc of headache. Concerned for CO poisoning.  I doubt that there is CO poisoning, but the alarm at home has patient concerned - so we will check CO level, which is neg pt will be discharged.    Final Clinical Impressions(s) / ED Diagnoses     Final diagnoses:  Acute nonintractable headache, unspecified headache type    New Prescriptions Discharge Medication List as of 09/30/2015  6:45 AM       Derwood Kaplan, MD 09/30/15 1660    Derwood Kaplan, MD 09/30/15 [phone]

## 2015-09-30 NOTE — Discharge Instructions (Signed)
The CO level is normal. Likely some chemical exposed.

## 2015-09-30 NOTE — ED Triage Notes (Signed)
Pt. requesting "carbon levels check " in her blood , pt. stated alarm sounded at home this morning and smelled odor at mattress this morning  , respirations unlabored .

## 2016-01-19 IMAGING — CR DG ANKLE COMPLETE 3+V*L*
3 series · 3 of 3 positions shown · non-contrast
Comparison: None.

CLINICAL DATA: Fall down steps, lateral malleolus pain.

EXAM:
LEFT ANKLE COMPLETE - 3+ VIEW; LEFT FOOT - COMPLETE 3+ VIEW

[x ankle ap left]
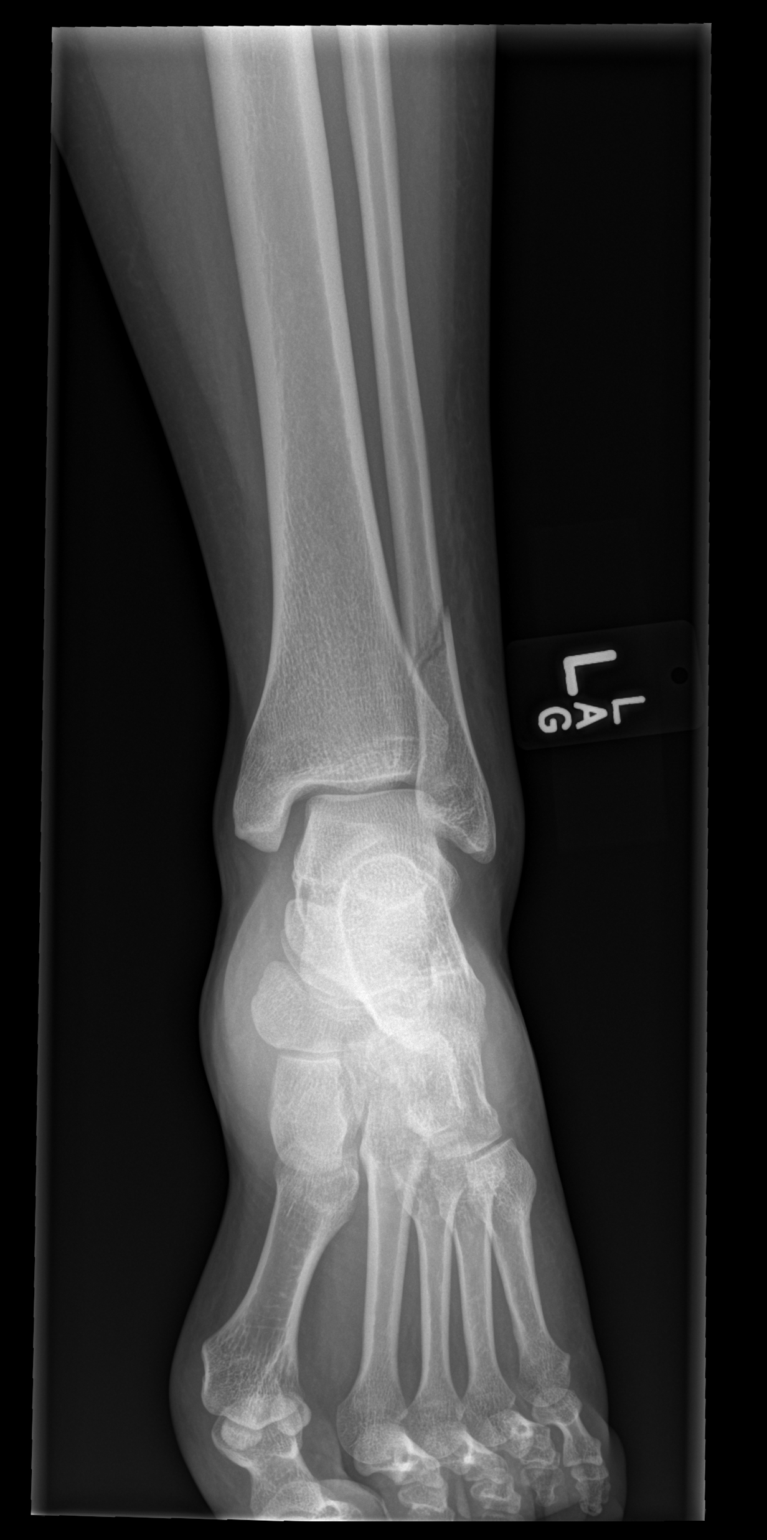

[x ankle obl left]
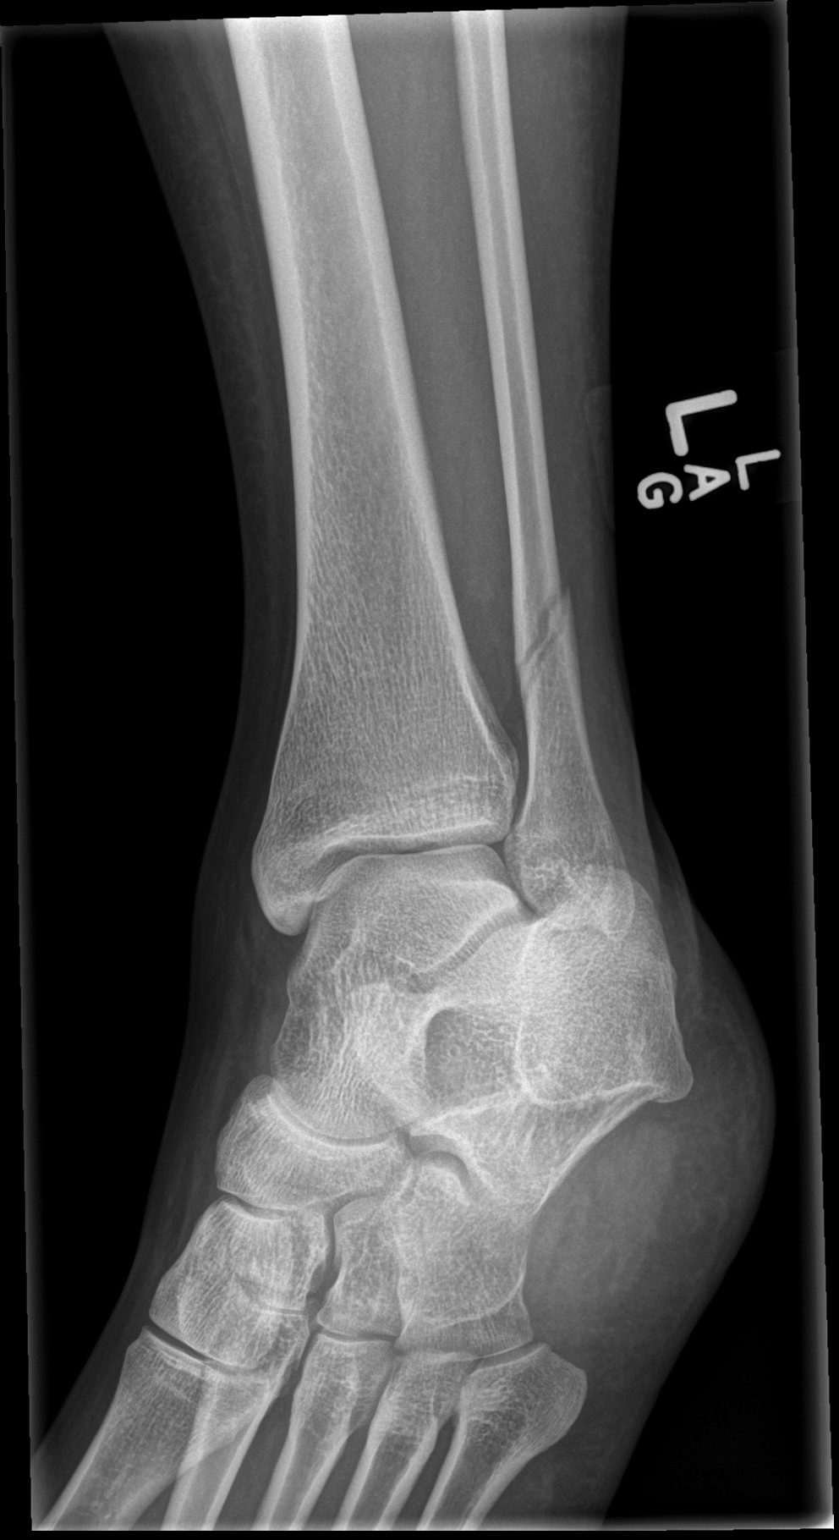

[x ankle lat left]
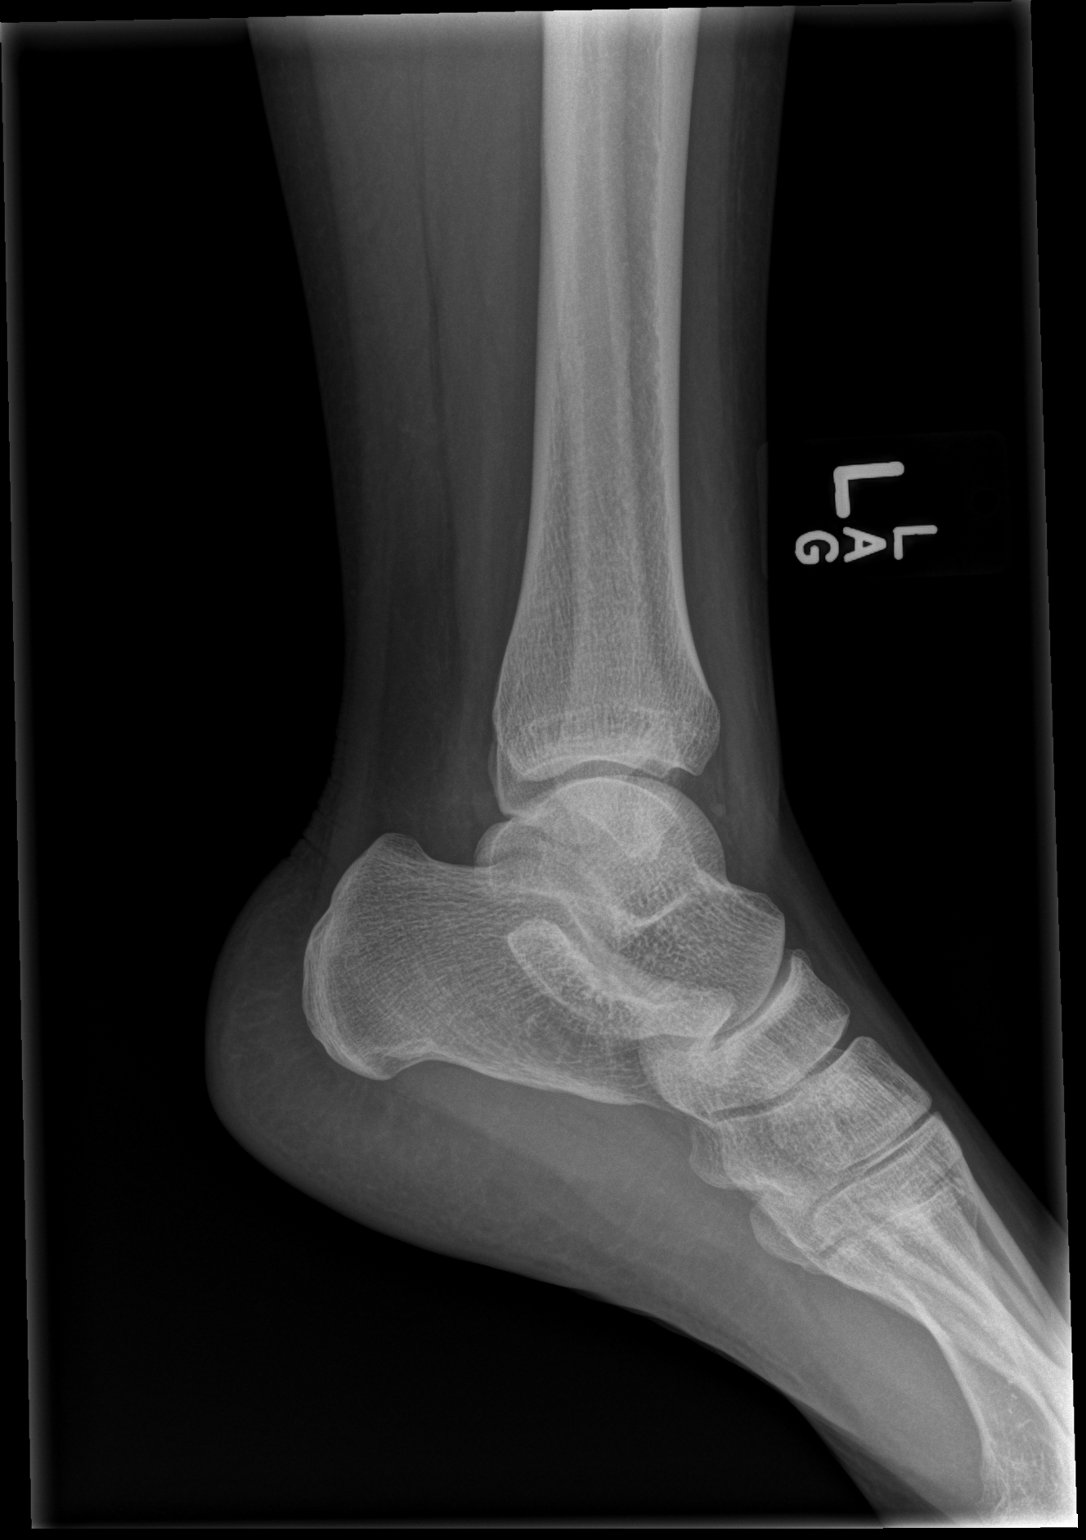

[3 of 3 positions shown; findings below may reference images not displayed]

FINDINGS: Oblique nondisplaced distal fibular diaphyseal fracture above the
ankle mortise. The ankle mortise appears congruent and tibiofibular
syndesmosis is intact. No dislocation. No destructive bony lesions.

Congenitally foreshortened fourth proximal phalanx. Mild lateral
ankle soft tissue swelling without subcutaneous gas or radiopaque
foreign bodies.
IMPRESSION: Nondisplaced distal fibular diaphyseal fracture without dislocation.

  By: Deeqa Rayaan Adlaho
